# Patient Record
Sex: Female | Born: 1960 | Race: White | Hispanic: No | Marital: Married | Smoking: Never smoker
Health system: Southern US, Community
[De-identification: ages and names within clinical notes are randomized; demographics above are authoritative.]

## PROBLEM LIST (undated history)

## (undated) DIAGNOSIS — I10 Essential (primary) hypertension: Secondary | ICD-10-CM

## (undated) HISTORY — DX: Essential (primary) hypertension: I10

---

## 1982-07-28 HISTORY — PX: BREAST EXCISIONAL BIOPSY: SUR124

## 2004-10-31 ENCOUNTER — Ambulatory Visit: Payer: Self-pay | Admitting: Unknown Physician Specialty

## 2005-12-17 ENCOUNTER — Ambulatory Visit: Payer: Self-pay | Admitting: Unknown Physician Specialty

## 2007-01-21 ENCOUNTER — Ambulatory Visit: Payer: Self-pay | Admitting: Unknown Physician Specialty

## 2007-07-09 ENCOUNTER — Ambulatory Visit: Payer: Self-pay | Admitting: General Surgery

## 2008-01-11 ENCOUNTER — Ambulatory Visit: Payer: Self-pay | Admitting: Cardiology

## 2008-01-21 ENCOUNTER — Ambulatory Visit: Payer: Self-pay | Admitting: Unknown Physician Specialty

## 2009-01-22 ENCOUNTER — Ambulatory Visit: Payer: Self-pay | Admitting: Unknown Physician Specialty

## 2009-02-06 ENCOUNTER — Ambulatory Visit: Payer: Self-pay | Admitting: Unknown Physician Specialty

## 2010-02-08 ENCOUNTER — Ambulatory Visit: Payer: Self-pay | Admitting: General Surgery

## 2010-03-05 ENCOUNTER — Ambulatory Visit: Payer: Self-pay | Admitting: Unknown Physician Specialty

## 2010-11-26 ENCOUNTER — Ambulatory Visit: Payer: Self-pay | Admitting: Unknown Physician Specialty

## 2011-03-11 ENCOUNTER — Ambulatory Visit: Payer: Self-pay | Admitting: Unknown Physician Specialty

## 2012-03-16 ENCOUNTER — Ambulatory Visit: Payer: Self-pay | Admitting: Unknown Physician Specialty

## 2013-03-22 ENCOUNTER — Ambulatory Visit: Payer: Self-pay | Admitting: Unknown Physician Specialty

## 2013-12-09 DIAGNOSIS — I10 Essential (primary) hypertension: Secondary | ICD-10-CM | POA: Insufficient documentation

## 2014-03-23 ENCOUNTER — Ambulatory Visit: Payer: Self-pay | Admitting: Unknown Physician Specialty

## 2015-03-26 ENCOUNTER — Other Ambulatory Visit: Payer: Self-pay | Admitting: Unknown Physician Specialty

## 2015-03-26 DIAGNOSIS — Z1231 Encounter for screening mammogram for malignant neoplasm of breast: Secondary | ICD-10-CM

## 2015-03-28 ENCOUNTER — Ambulatory Visit
Admission: RE | Admit: 2015-03-28 | Discharge: 2015-03-28 | Disposition: A | Discharge status: Home / Self Care | Payer: PRIVATE HEALTH INSURANCE | Source: Ambulatory Visit | Attending: Unknown Physician Specialty | Admitting: Unknown Physician Specialty

## 2015-03-28 DIAGNOSIS — Z1231 Encounter for screening mammogram for malignant neoplasm of breast: Secondary | ICD-10-CM | POA: Diagnosis present

## 2015-07-13 ENCOUNTER — Encounter: Payer: Self-pay | Admitting: Physician Assistant

## 2015-07-13 ENCOUNTER — Ambulatory Visit: Payer: Self-pay | Admitting: Physician Assistant

## 2015-07-13 VITALS — BP 130/70 | HR 108 | Temp 97.8°F

## 2015-07-13 DIAGNOSIS — J209 Acute bronchitis, unspecified: Secondary | ICD-10-CM

## 2015-07-13 MED ORDER — PSEUDOEPH-BROMPHEN-DM 30-2-10 MG/5ML PO SYRP: 5.0000 mL | ORAL_SOLUTION | Freq: Four times a day (QID) | ORAL | Status: DC | PRN

## 2015-07-13 MED ORDER — AZITHROMYCIN 250 MG PO TABS: ORAL_TABLET | ORAL | Status: DC

## 2015-07-13 NOTE — Progress Notes (Signed)
S.  Patient c/o cough and chest congestion for 5 days.  Denies Fever/chill or nausea, vomiting, and diarrhea. No palliative measure for compliant. O.  No acute distress.  HEENT unremarkable except for non-productive cough.  Neck supple, Lung with upper lobe Rales. Heart RRR. A. Bronchitis. P.Z-Pack and Bromfed DM.  F/U PRN.

## 2015-08-30 ENCOUNTER — Other Ambulatory Visit: Payer: Self-pay | Admitting: Emergency Medicine

## 2015-08-30 MED ORDER — LOSARTAN POTASSIUM 50 MG PO TABS: 50.0000 mg | ORAL_TABLET | Freq: Every day | ORAL | Status: DC

## 2015-08-30 NOTE — Telephone Encounter (Signed)
Received a faxed medication request from [facility] on [address]  Please advise.  Thank you. 

## 2015-08-30 NOTE — Telephone Encounter (Signed)
Med refill approved, pt had labs in June/2016 and were normal, recent visits show normal bp

## 2015-10-25 DIAGNOSIS — Z8679 Personal history of other diseases of the circulatory system: Secondary | ICD-10-CM | POA: Insufficient documentation

## 2015-11-06 ENCOUNTER — Encounter: Payer: Self-pay | Admitting: *Deleted

## 2015-11-14 ENCOUNTER — Ambulatory Visit: Payer: Self-pay | Admitting: General Surgery

## 2016-03-10 ENCOUNTER — Other Ambulatory Visit: Payer: Self-pay | Admitting: Obstetrics & Gynecology

## 2016-03-10 DIAGNOSIS — Z1231 Encounter for screening mammogram for malignant neoplasm of breast: Secondary | ICD-10-CM

## 2016-04-01 ENCOUNTER — Other Ambulatory Visit: Payer: Self-pay | Admitting: Physician Assistant

## 2016-04-01 NOTE — Telephone Encounter (Signed)
Med refill approved, pt needs fasting labs prior to med refill

## 2016-04-02 ENCOUNTER — Other Ambulatory Visit: Payer: Self-pay | Admitting: Obstetrics & Gynecology

## 2016-04-02 ENCOUNTER — Ambulatory Visit
Admission: RE | Admit: 2016-04-02 | Discharge: 2016-04-02 | Disposition: A | Discharge status: Home / Self Care | Payer: Managed Care, Other (non HMO) | Source: Ambulatory Visit | Attending: Obstetrics & Gynecology | Admitting: Obstetrics & Gynecology

## 2016-04-02 DIAGNOSIS — Z1231 Encounter for screening mammogram for malignant neoplasm of breast: Secondary | ICD-10-CM | POA: Diagnosis present

## 2016-04-04 ENCOUNTER — Other Ambulatory Visit: Payer: Self-pay

## 2016-04-04 DIAGNOSIS — Z299 Encounter for prophylactic measures, unspecified: Secondary | ICD-10-CM

## 2016-04-04 NOTE — Progress Notes (Signed)
Patient came in to have blood drawn for testing per Susan's authorization. 

## 2016-04-05 LAB — CMP12+LP+TP+TSH+6AC+CBC/D/PLT
A/G RATIO: 1.9 (ref 1.2–2.2)
ALBUMIN: 4.6 g/dL (ref 3.5–5.5)
ALT: 21 IU/L (ref 0–32)
AST: 22 IU/L (ref 0–40)
Alkaline Phosphatase: 67 IU/L (ref 39–117)
BASOS ABS: 0 10*3/uL (ref 0.0–0.2)
BUN / CREAT RATIO: 24 — AB (ref 9–23)
BUN: 19 mg/dL (ref 6–24)
Basos: 1 %
Bilirubin Total: 0.5 mg/dL (ref 0.0–1.2)
CHLORIDE: 101 mmol/L (ref 96–106)
CHOLESTEROL TOTAL: 234 mg/dL — AB (ref 100–199)
CREATININE: 0.79 mg/dL (ref 0.57–1.00)
Calcium: 10.1 mg/dL (ref 8.7–10.2)
Chol/HDL Ratio: 2.7 ratio units (ref 0.0–4.4)
EOS (ABSOLUTE): 0 10*3/uL (ref 0.0–0.4)
EOS: 1 %
Free Thyroxine Index: 2.4 (ref 1.2–4.9)
GFR, EST AFRICAN AMERICAN: 97 mL/min/{1.73_m2} (ref >59)
GFR, EST NON AFRICAN AMERICAN: 84 mL/min/{1.73_m2} (ref >59)
GGT: 16 IU/L (ref 0–60)
Globulin, Total: 2.4 g/dL (ref 1.5–4.5)
Glucose: 91 mg/dL (ref 65–99)
HDL: 87 mg/dL (ref >39)
Hematocrit: 37.3 % (ref 34.0–46.6)
Hemoglobin: 12.5 g/dL (ref 11.1–15.9)
IRON: 100 ug/dL (ref 27–159)
Immature Grans (Abs): 0 10*3/uL (ref 0.0–0.1)
Immature Granulocytes: 0 %
LDH: 218 IU/L (ref 119–226)
LDL Calculated: 136 mg/dL — ABNORMAL HIGH (ref 0–99)
LYMPHS ABS: 1.4 10*3/uL (ref 0.7–3.1)
Lymphs: 41 %
MCH: 32.6 pg (ref 26.6–33.0)
MCHC: 33.5 g/dL (ref 31.5–35.7)
MCV: 97 fL (ref 79–97)
MONOCYTES: 6 %
Monocytes Absolute: 0.2 10*3/uL (ref 0.1–0.9)
NEUTROS ABS: 1.8 10*3/uL (ref 1.4–7.0)
Neutrophils: 51 %
PHOSPHORUS: 4.1 mg/dL (ref 2.5–4.5)
PLATELETS: 219 10*3/uL (ref 150–379)
POTASSIUM: 4.9 mmol/L (ref 3.5–5.2)
RBC: 3.84 x10E6/uL (ref 3.77–5.28)
RDW: 14.4 % (ref 12.3–15.4)
Sodium: 142 mmol/L (ref 134–144)
T3 UPTAKE RATIO: 30 % (ref 24–39)
T4 TOTAL: 7.9 ug/dL (ref 4.5–12.0)
TOTAL PROTEIN: 7 g/dL (ref 6.0–8.5)
TSH: 2.17 u[IU]/mL (ref 0.450–4.500)
Triglycerides: 54 mg/dL (ref 0–149)
URIC ACID: 3.9 mg/dL (ref 2.5–7.1)
VLDL CHOLESTEROL CAL: 11 mg/dL (ref 5–40)
WBC: 3.4 10*3/uL (ref 3.4–10.8)

## 2016-04-05 LAB — VITAMIN D 25 HYDROXY (VIT D DEFICIENCY, FRACTURES): Vit D, 25-Hydroxy: 36.4 ng/mL (ref 30.0–100.0)

## 2016-04-30 ENCOUNTER — Other Ambulatory Visit: Payer: Self-pay | Admitting: Physician Assistant

## 2016-05-03 ENCOUNTER — Other Ambulatory Visit: Payer: Self-pay | Admitting: Physician Assistant

## 2016-05-06 ENCOUNTER — Ambulatory Visit: Payer: Self-pay | Admitting: Physician Assistant

## 2016-05-06 ENCOUNTER — Encounter: Payer: Self-pay | Admitting: Physician Assistant

## 2016-05-06 VITALS — BP 160/80 | HR 104 | Temp 98.5°F

## 2016-05-06 DIAGNOSIS — I10 Essential (primary) hypertension: Secondary | ICD-10-CM

## 2016-05-06 MED ORDER — LOSARTAN POTASSIUM 50 MG PO TABS: 50.0000 mg | ORAL_TABLET | Freq: Every day | ORAL | 11 refills | Status: DC

## 2016-05-06 NOTE — Progress Notes (Signed)
S: here for bp check, labs were normal, needs med refill on losartan, saw her cardiologist in April  O: vitals wnl nad, lungs c t a, cv rrr, no bruits noted at carotids, neuro intact  A: htn  P: losartan 50mg  qd #30 11 refills

## 2016-07-10 DIAGNOSIS — S29012A Strain of muscle and tendon of back wall of thorax, initial encounter: Secondary | ICD-10-CM | POA: Insufficient documentation

## 2016-09-11 ENCOUNTER — Encounter: Payer: Self-pay | Admitting: Physician Assistant

## 2016-09-11 ENCOUNTER — Ambulatory Visit: Payer: Self-pay | Admitting: Physician Assistant

## 2016-09-11 VITALS — BP 130/80 | HR 99 | Temp 98.2°F

## 2016-09-11 DIAGNOSIS — Z0189 Encounter for other specified special examinations: Secondary | ICD-10-CM

## 2016-09-11 DIAGNOSIS — Z008 Encounter for other general examination: Secondary | ICD-10-CM

## 2016-09-11 DIAGNOSIS — R21 Rash and other nonspecific skin eruption: Secondary | ICD-10-CM

## 2016-09-11 MED ORDER — FLUCONAZOLE 150 MG PO TABS: ORAL_TABLET | ORAL | 0 refills | Status: DC

## 2016-09-11 MED ORDER — DEXAMETHASONE SODIUM PHOSPHATE 10 MG/ML IJ SOLN
10.0000 mg | Freq: Once | INTRAMUSCULAR | Status: AC
  Administered 2016-09-11: 10 mg via INTRAMUSCULAR

## 2016-09-11 NOTE — Progress Notes (Signed)
S: c/o rash on neck, chest, and up on left jaw/face, areas are very itchy, no new exposures, did start a high protein diet about 3 weeks ago, sx started after that, sx have gotten worse over last few days, no blisters or open areas  O: vitals wnl, nad, skin with long scratch marks on neck, chest, dry patch on left cheek, no vesicles, no blisters, no pustules noted, lungs c t a, cv rrr  A: rash  P: decadron 10mg  qd, diflucan in case yeast from high protein diet

## 2017-01-26 ENCOUNTER — Other Ambulatory Visit: Payer: Self-pay

## 2017-01-26 ENCOUNTER — Encounter (INDEPENDENT_AMBULATORY_CARE_PROVIDER_SITE_OTHER): Payer: Self-pay

## 2017-01-26 DIAGNOSIS — Z299 Encounter for prophylactic measures, unspecified: Secondary | ICD-10-CM

## 2017-01-26 NOTE — Progress Notes (Signed)
Patient came in to have blood drawn for a new patient appointment with [facility] Dal[facility]

## 2017-01-27 LAB — VITAMIN D 25 HYDROXY (VIT D DEFICIENCY, FRACTURES)

## 2017-01-27 LAB — CMP12+LP+TP+TSH+6AC+CBC/D/PLT
BASOS ABS: 0 10*3/uL (ref 0.0–0.2)
Basos: 0 %
EOS (ABSOLUTE): 0 10*3/uL (ref 0.0–0.4)
Eos: 0 %
Hematocrit: 39.7 % (ref 34.0–46.6)
Hemoglobin: 13 g/dL (ref 11.1–15.9)
Immature Grans (Abs): 0 10*3/uL (ref 0.0–0.1)
Immature Granulocytes: 0 %
LYMPHS ABS: 1.1 10*3/uL (ref 0.7–3.1)
Lymphs: 30 %
MCH: 32.7 pg (ref 26.6–33.0)
MCHC: 32.7 g/dL (ref 31.5–35.7)
MCV: 100 fL — ABNORMAL HIGH (ref 79–97)
MONOS ABS: 0.2 10*3/uL (ref 0.1–0.9)
Monocytes: 5 %
Neutrophils Absolute: 2.5 10*3/uL (ref 1.4–7.0)
Neutrophils: 65 %
PLATELETS: 227 10*3/uL (ref 150–379)
RBC: 3.98 x10E6/uL (ref 3.77–5.28)
RDW: 14.4 % (ref 12.3–15.4)
WBC: 3.8 10*3/uL (ref 3.4–10.8)

## 2017-01-29 ENCOUNTER — Other Ambulatory Visit: Payer: Self-pay | Admitting: Physician Assistant

## 2017-01-29 DIAGNOSIS — Z299 Encounter for prophylactic measures, unspecified: Secondary | ICD-10-CM

## 2017-01-30 LAB — COMPREHENSIVE METABOLIC PANEL
ALT: 19 IU/L (ref 0–32)
AST: 15 IU/L (ref 0–40)
Albumin/Globulin Ratio: 2 (ref 1.2–2.2)
Albumin: 4.9 g/dL (ref 3.5–5.5)
Alkaline Phosphatase: 68 IU/L (ref 39–117)
BILIRUBIN TOTAL: 0.3 mg/dL (ref 0.0–1.2)
BUN/Creatinine Ratio: 14 (ref 9–23)
BUN: 11 mg/dL (ref 6–24)
CHLORIDE: 104 mmol/L (ref 96–106)
CO2: 24 mmol/L (ref 20–29)
Calcium: 10.4 mg/dL — ABNORMAL HIGH (ref 8.7–10.2)
Creatinine, Ser: 0.79 mg/dL (ref 0.57–1.00)
GFR calc Af Amer: 96 mL/min/{1.73_m2} (ref >59)
GFR calc non Af Amer: 83 mL/min/{1.73_m2} (ref >59)
GLUCOSE: 99 mg/dL (ref 65–99)
Globulin, Total: 2.4 g/dL (ref 1.5–4.5)
Potassium: 4.8 mmol/L (ref 3.5–5.2)
Sodium: 146 mmol/L — ABNORMAL HIGH (ref 134–144)
Total Protein: 7.3 g/dL (ref 6.0–8.5)

## 2017-01-30 LAB — LIPID PANEL
Chol/HDL Ratio: 2.8 ratio (ref 0.0–4.4)
Cholesterol, Total: 218 mg/dL — ABNORMAL HIGH (ref 100–199)
HDL: 79 mg/dL (ref >39)
LDL Calculated: 126 mg/dL — ABNORMAL HIGH (ref 0–99)
Triglycerides: 65 mg/dL (ref 0–149)
VLDL CHOLESTEROL CAL: 13 mg/dL (ref 5–40)

## 2017-01-30 LAB — THYROID PANEL WITH TSH
Free Thyroxine Index: 3 (ref 1.2–4.9)
T3 Uptake Ratio: 31 % (ref 24–39)
T4, Total: 9.6 ug/dL (ref 4.5–12.0)
TSH: 2.8 u[IU]/mL (ref 0.450–4.500)

## 2017-01-30 LAB — VITAMIN D 25 HYDROXY (VIT D DEFICIENCY, FRACTURES): VIT D 25 HYDROXY: 36.3 ng/mL (ref 30.0–100.0)

## 2017-02-12 ENCOUNTER — Other Ambulatory Visit: Payer: Self-pay | Admitting: Obstetrics and Gynecology

## 2017-02-12 DIAGNOSIS — Z1231 Encounter for screening mammogram for malignant neoplasm of breast: Secondary | ICD-10-CM

## 2017-04-06 ENCOUNTER — Ambulatory Visit
Admission: RE | Admit: 2017-04-06 | Discharge: 2017-04-06 | Disposition: A | Discharge status: Home / Self Care | Payer: Managed Care, Other (non HMO) | Source: Ambulatory Visit | Attending: Obstetrics and Gynecology | Admitting: Obstetrics and Gynecology

## 2017-04-06 ENCOUNTER — Ambulatory Visit: Payer: Self-pay | Admitting: Physician Assistant

## 2017-04-06 ENCOUNTER — Encounter: Payer: Self-pay | Admitting: Physician Assistant

## 2017-04-06 VITALS — BP 138/80 | HR 78 | Temp 98.4°F

## 2017-04-06 DIAGNOSIS — Z1231 Encounter for screening mammogram for malignant neoplasm of breast: Secondary | ICD-10-CM | POA: Insufficient documentation

## 2017-04-06 DIAGNOSIS — I10 Essential (primary) hypertension: Secondary | ICD-10-CM

## 2017-04-06 MED ORDER — LOSARTAN POTASSIUM 50 MG PO TABS: 50.0000 mg | ORAL_TABLET | Freq: Every day | ORAL | 11 refills | Status: DC

## 2017-04-06 NOTE — Progress Notes (Signed)
S: here for bp med refill, had a full physical by her gyn in July, labs were normal, no complaints  O: vitals w bp 138/80, lungs c t a, cv rrr  A: htn  P: refill on losartan

## 2018-01-27 ENCOUNTER — Other Ambulatory Visit: Payer: Self-pay

## 2018-01-27 DIAGNOSIS — Z0000 Encounter for general adult medical examination without abnormal findings: Secondary | ICD-10-CM

## 2018-01-28 LAB — LIPID PANEL
CHOLESTEROL TOTAL: 267 mg/dL — AB (ref 100–199)
Chol/HDL Ratio: 3.1 ratio (ref 0.0–4.4)
HDL: 87 mg/dL (ref >39)
LDL CALC: 163 mg/dL — AB (ref 0–99)
Triglycerides: 84 mg/dL (ref 0–149)
VLDL CHOLESTEROL CAL: 17 mg/dL (ref 5–40)

## 2018-01-28 LAB — CBC WITH DIFFERENTIAL/PLATELET
BASOS ABS: 0 10*3/uL (ref 0.0–0.2)
Basos: 1 %
EOS (ABSOLUTE): 0.1 10*3/uL (ref 0.0–0.4)
Eos: 2 %
HEMOGLOBIN: 13.2 g/dL (ref 11.1–15.9)
Hematocrit: 39.6 % (ref 34.0–46.6)
IMMATURE GRANS (ABS): 0 10*3/uL (ref 0.0–0.1)
IMMATURE GRANULOCYTES: 0 %
LYMPHS: 41 %
Lymphocytes Absolute: 1.4 10*3/uL (ref 0.7–3.1)
MCH: 33.2 pg — AB (ref 26.6–33.0)
MCHC: 33.3 g/dL (ref 31.5–35.7)
MCV: 100 fL — ABNORMAL HIGH (ref 79–97)
MONOCYTES: 8 %
Monocytes Absolute: 0.3 10*3/uL (ref 0.1–0.9)
NEUTROS ABS: 1.6 10*3/uL (ref 1.4–7.0)
NEUTROS PCT: 48 %
PLATELETS: 227 10*3/uL (ref 150–450)
RBC: 3.98 x10E6/uL (ref 3.77–5.28)
RDW: 13.1 % (ref 12.3–15.4)
WBC: 3.4 10*3/uL (ref 3.4–10.8)

## 2018-01-28 LAB — COMPREHENSIVE METABOLIC PANEL
ALBUMIN: 4.6 g/dL (ref 3.5–5.5)
ALT: 26 IU/L (ref 0–32)
AST: 23 IU/L (ref 0–40)
Albumin/Globulin Ratio: 1.8 (ref 1.2–2.2)
Alkaline Phosphatase: 78 IU/L (ref 39–117)
BILIRUBIN TOTAL: 0.4 mg/dL (ref 0.0–1.2)
BUN/Creatinine Ratio: 21 (ref 9–23)
BUN: 18 mg/dL (ref 6–24)
CHLORIDE: 100 mmol/L (ref 96–106)
CO2: 24 mmol/L (ref 20–29)
CREATININE: 0.84 mg/dL (ref 0.57–1.00)
Calcium: 9.7 mg/dL (ref 8.7–10.2)
GFR calc non Af Amer: 77 mL/min/{1.73_m2} (ref >59)
GFR, EST AFRICAN AMERICAN: 89 mL/min/{1.73_m2} (ref >59)
GLUCOSE: 93 mg/dL (ref 65–99)
Globulin, Total: 2.6 g/dL (ref 1.5–4.5)
Potassium: 4.8 mmol/L (ref 3.5–5.2)
Sodium: 140 mmol/L (ref 134–144)
TOTAL PROTEIN: 7.2 g/dL (ref 6.0–8.5)

## 2018-01-28 LAB — TSH: TSH: 3.01 u[IU]/mL (ref 0.450–4.500)

## 2018-02-16 ENCOUNTER — Other Ambulatory Visit: Payer: Self-pay | Admitting: Obstetrics and Gynecology

## 2018-02-16 DIAGNOSIS — Z1231 Encounter for screening mammogram for malignant neoplasm of breast: Secondary | ICD-10-CM

## 2018-04-08 ENCOUNTER — Ambulatory Visit
Admission: RE | Admit: 2018-04-08 | Discharge: 2018-04-08 | Disposition: A | Discharge status: Home / Self Care | Payer: Managed Care, Other (non HMO) | Source: Ambulatory Visit | Attending: Obstetrics and Gynecology | Admitting: Obstetrics and Gynecology

## 2018-04-08 DIAGNOSIS — Z1231 Encounter for screening mammogram for malignant neoplasm of breast: Secondary | ICD-10-CM | POA: Diagnosis not present

## 2018-04-23 ENCOUNTER — Ambulatory Visit: Payer: Self-pay | Admitting: Family Medicine

## 2018-04-23 VITALS — BP 136/90 | HR 95 | Temp 99.3°F | Resp 14

## 2018-04-23 DIAGNOSIS — R05 Cough: Secondary | ICD-10-CM

## 2018-04-23 DIAGNOSIS — R059 Cough, unspecified: Secondary | ICD-10-CM

## 2018-04-23 DIAGNOSIS — J0100 Acute maxillary sinusitis, unspecified: Secondary | ICD-10-CM

## 2018-04-23 MED ORDER — AMOXICILLIN-POT CLAVULANATE 875-125 MG PO TABS: 1.0000 | ORAL_TABLET | Freq: Two times a day (BID) | ORAL | 0 refills | Status: AC

## 2018-04-23 MED ORDER — BENZONATATE 200 MG PO CAPS: 200.0000 mg | ORAL_CAPSULE | Freq: Every evening | ORAL | 0 refills | Status: DC | PRN

## 2018-04-23 NOTE — Progress Notes (Signed)
Subjective: Congestion     Sherry Kelly is a 57 y.o. female who presents for evaluation of nasal congestion, right-sided facial pressure, sore throat, right ear fullness, fatigue, and productive cough with green sputum for 3-4 days.  Patient reports gradual worsening of right-sided facial pressure. Treatment to date: OTC cough suppressant.  Denies rash, nausea, vomiting, diarrhea, SOB, wheezing, chest or back pain, ear pain,  difficulty swallowing, confusion, headache, body aches, fever, chills, severe symptoms, or initial improvement and then worsening of symptoms. History of smoking, asthma, COPD: Negative. History of recurrent sinus and/or lung infections: Negative. Medical history: Patient denies any. Antibiotic use in the last 3 months: Patient denies.   Review of Systems Pertinent items noted in HPI and remainder of comprehensive ROS otherwise negative.     Objective:   Physical Exam General: Awake, alert, and oriented. No acute distress. Well developed, hydrated and nourished. Appears stated age. Nontoxic appearance.  HEENT:  PND noted.  Mild erythema to posterior oropharynx.  No edema or exudates of pharynx or tonsils. No erythema or bulging of TM.  Fullness to right tympanic membrane with clear air/fluid level present.  No opaque fluid and TMs intact bilaterally.  Ear canals clear bilaterally.  Mild erythema/edema to nasal mucosa. Sinuses nontender. Supple neck without adenopathy. Cardiac: Heart rate and rhythm are normal. No murmurs, gallops, or rubs are auscultated. S1 and S2 are heard and are of normal intensity.  Respiratory: No signs of respiratory distress. Lungs clear. No tachypnea. Able to speak in full sentences without dyspnea. Nonlabored respirations.  Skin: Skin is warm, dry and intact. Appropriate color for ethnicity. No cyanosis noted.   Assessment:   Sinusitis Bilateral eustachian tube dysfunction Acute upper respiratory infection  Plan:    Discussed  diagnosis and treatment of URI, sinusitis, and eustachian tube dysfunction. Suggested symptomatic OTC remedies. Nasal saline spray for congestion.   Prescribed Augmentin.  Prescribed Tessalon Perles to use as needed at night for cough.  Recommended Flonase for eustachian tube dysfunction.  Patient already has this at home.  Provided her with instructions for use.  Patient denies any history of eye disorders such as glaucoma. Discussed side/adverse effects of medications. Discussed red flag symptoms and circumstances with which to seek medical care.   New Prescriptions   AMOXICILLIN-CLAVULANATE (AUGMENTIN) 875-125 MG TABLET    Take 1 tablet by mouth 2 (two) times daily for 10 days.   BENZONATATE (TESSALON) 200 MG CAPSULE    Take 1 capsule (200 mg total) by mouth at bedtime as needed for cough.

## 2018-10-04 ENCOUNTER — Encounter: Payer: Self-pay | Admitting: Adult Health

## 2018-10-04 ENCOUNTER — Ambulatory Visit: Payer: Managed Care, Other (non HMO) | Admitting: Adult Health

## 2018-10-04 VITALS — BP 140/80 | HR 97 | Temp 98.3°F | Resp 14

## 2018-10-04 DIAGNOSIS — J020 Streptococcal pharyngitis: Secondary | ICD-10-CM

## 2018-10-04 LAB — POCT RAPID STREP A (OFFICE): RAPID STREP A SCREEN: POSITIVE — AB

## 2018-10-04 MED ORDER — AMOXICILLIN 875 MG PO TABS: 875.0000 mg | ORAL_TABLET | Freq: Two times a day (BID) | ORAL | 0 refills | Status: DC

## 2018-10-04 NOTE — Patient Instructions (Signed)
Pharyngitis  Pharyngitis is a sore throat (pharynx). This is when there is redness, pain, and swelling in your throat. Most of the time, this condition gets better on its own. In some cases, you may need medicine. Follow these instructions at home:  Take over-the-counter and prescription medicines only as told by your doctor. ? If you were prescribed an antibiotic medicine, take it as told by your doctor. Do not stop taking the antibiotic even if you start to feel better. ? Do not give children aspirin. Aspirin has been linked to Reye syndrome.  Drink enough water and fluids to keep your pee (urine) clear or pale yellow.  Get a lot of rest.  Rinse your mouth (gargle) with a salt-water mixture 3-4 times a day or as needed. To make a salt-water mixture, completely dissolve -1 tsp of salt in 1 cup of warm water.  If your doctor approves, you may use throat lozenges or sprays to soothe your throat. Contact a doctor if:  You have large, tender lumps in your neck.  You have a rash.  You cough up green, yellow-brown, or bloody spit. Get help right away if:  You have a stiff neck.  You drool or cannot swallow liquids.  You cannot drink or take medicines without throwing up.  You have very bad pain that does not go away with medicine.  You have problems breathing, and it is not from a stuffy nose.  You have new pain and swelling in your knees, ankles, wrists, or elbows. Summary  Pharyngitis is a sore throat (pharynx). This is when there is redness, pain, and swelling in your throat.  If you were prescribed an antibiotic medicine, take it as told by your doctor. Do not stop taking the antibiotic even if you start to feel better.  Most of the time, pharyngitis gets better on its own. Sometimes, you may need medicine. This information is not intended to replace advice given to you by your health care provider. Make sure you discuss any questions you have with your health care  provider. Document Released: 12/31/2007 Document Revised: 08/19/2016 Document Reviewed: 08/19/2016 Elsevier Interactive Patient Education  2019 Elsevier Inc. Amoxicillin capsules or tablets What is this medicine? AMOXICILLIN (a mox i SIL in) is a penicillin antibiotic. It is used to treat certain kinds of bacterial infections. It will not work for colds, flu, or other viral infections. This medicine may be used for other purposes; ask your health care provider or pharmacist if you have questions. COMMON BRAND NAME(S): Amoxil, Moxilin, Sumox, Trimox What should I tell my health care provider before I take this medicine? They need to know if you have any of these conditions: -asthma -kidney disease -an unusual or allergic reaction to amoxicillin, other penicillins, cephalosporin antibiotics, other medicines, foods, dyes, or preservatives -pregnant or trying to get pregnant -breast-feeding How should I use this medicine? Take this medicine by mouth with a glass of water. Follow the directions on your prescription label. You may take this medicine with food or on an empty stomach. Take your medicine at regular intervals. Do not take your medicine more often than directed. Take all of your medicine as directed even if you think your are better. Do not skip doses or stop your medicine early. Talk to your pediatrician regarding the use of this medicine in children. While this drug may be prescribed for selected conditions, precautions do apply. Overdosage: If you think you have taken too much of this medicine contact a poison  control center or emergency room at once. NOTE: This medicine is only for you. Do not share this medicine with others. What if I miss a dose? If you miss a dose, take it as soon as you can. If it is almost time for your next dose, take only that dose. Do not take double or extra doses. What may interact with this medicine? -amiloride -birth control  pills -chloramphenicol -macrolides -probenecid -sulfonamides -tetracyclines This list may not describe all possible interactions. Give your health care provider a list of all the medicines, herbs, non-prescription drugs, or dietary supplements you use. Also tell them if you smoke, drink alcohol, or use illegal drugs. Some items may interact with your medicine. What should I watch for while using this medicine? Tell your doctor or health care professional if your symptoms do not improve in 2 or 3 days. Take all of the doses of your medicine as directed. Do not skip doses or stop your medicine early. If you are diabetic, you may get a false positive result for sugar in your urine with certain brands of urine tests. Check with your doctor. Do not treat diarrhea with over-the-counter products. Contact your doctor if you have diarrhea that lasts more than 2 days or if the diarrhea is severe and watery. What side effects may I notice from receiving this medicine? Side effects that you should report to your doctor or health care professional as soon as possible: -allergic reactions like skin rash, itching or hives, swelling of the face, lips, or tongue -breathing problems -dark urine -redness, blistering, peeling or loosening of the skin, including inside the mouth -seizures -severe or watery diarrhea -trouble passing urine or change in the amount of urine -unusual bleeding or bruising -unusually weak or tired -yellowing of the eyes or skin Side effects that usually do not require medical attention (report to your doctor or health care professional if they continue or are bothersome): -dizziness -headache -stomach upset -trouble sleeping This list may not describe all possible side effects. Call your doctor for medical advice about side effects. You may report side effects to FDA at 1-800-FDA-1088. Where should I keep my medicine? Keep out of the reach of children. Store between 68 and 77  degrees F (20 and 25 degrees C). Keep bottle closed tightly. Throw away any unused medicine after the expiration date. NOTE: This sheet is a summary. It may not cover all possible information. If you have questions about this medicine, talk to your doctor, pharmacist, or health care provider.  2019 Elsevier/Gold Standard (2007-10-05 14:10:59)

## 2018-10-04 NOTE — Progress Notes (Signed)
[facility] Subjective:     Patient ID: Sherry Kelly, female   DOB: March 07, 1961, 58 y.o.   MRN: [id number]  HPI   Blood pressure 140/80, pulse 97, temperature 98.3 F (36.8 C), temperature source Oral, resp. rate 14, SpO2 100 %. Patient is a 58 year old female in no acute distress who comes to the clinic for complaints of sore throat since  March 6th 2020.  Denies any known exposures - she keeps her 49 month old grand child.   No Known Allergies   Patient  denies any fever, body aches,chills, rash, chest pain, shortness of breath, nausea, vomiting, or diarrhea.   Christeen Douglas, MD is primary care.   No Known Allergies   Patient Active Problem List   Diagnosis Date Noted  . Strain of thoracic back region 07/10/2016  . H/O sinus tachycardia 10/25/2015  . Hypertension 12/09/2013    Current Outpatient Medications:  .  citalopram (CELEXA) 20 MG tablet, Take by mouth., Disp: , Rfl:  .  irbesartan (AVAPRO) 150 MG tablet, irbesartan 150 mg tablet, Disp: , Rfl:    Review of Systems  Constitutional: Negative.   HENT: Positive for rhinorrhea and sore throat. Negative for congestion, dental problem, drooling, ear discharge, ear pain, facial swelling, hearing loss, mouth sores, nosebleeds, postnasal drip, sinus pressure, sinus pain, sneezing, tinnitus, trouble swallowing and voice change.   Eyes: Negative.   Respiratory: Negative.   Cardiovascular: Negative.   Gastrointestinal: Negative.   Endocrine: Negative for polydipsia, polyphagia and polyuria.  Genitourinary: Negative.   Musculoskeletal: Negative.   Skin: Negative.   Allergic/Immunologic: Negative.   Neurological: Negative.   Hematological: Negative.   Psychiatric/Behavioral: Negative.      Objective:   Physical Exam Vitals signs reviewed.  Constitutional:      General: She is not in acute distress.    Appearance: She is well-developed and normal weight. She is not  ill-appearing, toxic-appearing or diaphoretic.     Comments: Patient is alert and oriented and responsive to questions Engages in eye contact with provider. Speaks in full sentences without any pauses without any shortness of breath or distress.  No Stridor.  HENT:     Head: Normocephalic and atraumatic.     Salivary Glands: Right salivary gland is not diffusely enlarged or tender. Left salivary gland is not diffusely enlarged or tender.     Right Ear: Ear canal normal. No drainage, swelling or tenderness. A middle ear effusion is present. Tympanic membrane is not erythematous.     Left Ear: Ear canal normal. No drainage, swelling or tenderness. A middle ear effusion is present. Tympanic membrane is not erythematous.     Nose: Rhinorrhea present. No congestion.     Comments: Scant     Mouth/Throat:     Mouth: Mucous membranes are moist. No oral lesions.     Pharynx: Posterior oropharyngeal erythema present. No pharyngeal swelling, oropharyngeal exudate or uvula swelling.     Tonsils: No tonsillar exudate or tonsillar abscesses. Swelling: 0 on the right. 0 on the left.     Comments: Oropharynx patent. Eyes:     Extraocular Movements:     Right eye: Normal extraocular motion.     Left eye: Normal extraocular motion.     Conjunctiva/sclera: Conjunctivae normal.     Pupils: Pupils are equal, round, and reactive to light.  Neck:     Musculoskeletal: Normal range of motion and neck supple.     Thyroid: No thyromegaly.  Cardiovascular:     Rate and Rhythm: Normal rate and regular rhythm.     Heart sounds: Normal heart sounds. No murmur. No friction rub. No gallop.   Pulmonary:     Effort: Pulmonary effort is normal. No respiratory distress.     Breath sounds: Normal breath sounds. No stridor. No wheezing, rhonchi or rales.  Chest:     Chest wall: No tenderness.  Abdominal:     Palpations: Abdomen is soft.  Lymphadenopathy:     Cervical: Cervical adenopathy (mild shotty soft mobile  slightly tender bilateral  ) present.     Right cervical: Superficial cervical adenopathy present. No deep or posterior cervical adenopathy.    Left cervical: Superficial cervical adenopathy present. No deep or posterior cervical adenopathy.  Skin:    General: Skin is warm.     Capillary Refill: Capillary refill takes less than 2 seconds.     Nails: There is no clubbing.   Neurological:     General: No focal deficit present.     Mental Status: She is alert and oriented to person, place, and time.     GCS: GCS eye subscore is 4. GCS verbal subscore is 5. GCS motor subscore is 6.     Cranial Nerves: Cranial nerves are intact.     Sensory: Sensation is intact.     Motor: Motor function is intact.     Coordination: Coordination is intact.     Gait: Gait is intact. Gait normal.     Comments: Patient moves on and off of exam table and in room without difficulty. Gait is normal in hall and in room. Patient is oriented to person place time and situation. Patient answers questions appropriately and engages in conversation.   Psychiatric:        Attention and Perception: Attention normal.        Mood and Affect: Mood normal.        Speech: Speech normal.        Behavior: Behavior normal. Behavior is cooperative.        Thought Content: Thought content normal.        Cognition and Memory: Cognition normal.       Assessment:   Pharyngitis due to Streptococcus species - Plan: POCT Rapid Strep A-Office (CPT [phone])      Plan:   Eula was seen today for sore throat.  Diagnoses and all orders for this visit:  Pharyngitis due to Streptococcus species -     POCT Rapid Strep A-Office (CPT [phone])  Other orders -     amoxicillin (AMOXIL) 875 MG tablet; Take 1 tablet (875 mg total) by mouth 2 (two) times daily.  Patient declined AVS and reviewed After Visit Summary(AVS) with patient. Patient is advised to read the after visit summary as well and let the provider know if any question, concerns  or clarifications are needed.   Advised patient call the office or your primary care doctor for an appointment if no improvement within 72 hours or if any symptoms change or worsen at any time  Advised ER or urgent Care if after hours or on weekend. Call 911 for emergency symptoms at any time.Patinet verbalized understanding of all instructions given/reviewed and treatment plan and has no further questions or concerns at this time.    Patient verbalized understanding of all instructions given and denies any further questions at this time.

## 2019-02-08 ENCOUNTER — Other Ambulatory Visit: Payer: Self-pay

## 2019-02-08 ENCOUNTER — Other Ambulatory Visit: Payer: Managed Care, Other (non HMO)

## 2019-02-08 DIAGNOSIS — Z0000 Encounter for general adult medical examination without abnormal findings: Secondary | ICD-10-CM | POA: Diagnosis not present

## 2019-02-09 LAB — LIPID PANEL WITH LDL/HDL RATIO
Cholesterol, Total: 263 mg/dL — ABNORMAL HIGH (ref 100–199)
HDL: 73 mg/dL (ref >39)
LDL Calculated: 170 mg/dL — ABNORMAL HIGH (ref 0–99)
LDl/HDL Ratio: 2.3 ratio (ref 0.0–3.2)
Triglycerides: 100 mg/dL (ref 0–149)
VLDL Cholesterol Cal: 20 mg/dL (ref 5–40)

## 2019-02-09 LAB — CBC WITH DIFFERENTIAL/PLATELET
Basophils Absolute: 0 10*3/uL (ref 0.0–0.2)
Basos: 1 %
EOS (ABSOLUTE): 0.1 10*3/uL (ref 0.0–0.4)
Eos: 3 %
Hematocrit: 38.2 % (ref 34.0–46.6)
Hemoglobin: 12.9 g/dL (ref 11.1–15.9)
Immature Grans (Abs): 0 10*3/uL (ref 0.0–0.1)
Immature Granulocytes: 0 %
Lymphocytes Absolute: 1.2 10*3/uL (ref 0.7–3.1)
Lymphs: 41 %
MCH: 33.8 pg — ABNORMAL HIGH (ref 26.6–33.0)
MCHC: 33.8 g/dL (ref 31.5–35.7)
MCV: 100 fL — ABNORMAL HIGH (ref 79–97)
Monocytes Absolute: 0.2 10*3/uL (ref 0.1–0.9)
Monocytes: 6 %
Neutrophils Absolute: 1.4 10*3/uL (ref 1.4–7.0)
Neutrophils: 49 %
Platelets: 204 10*3/uL (ref 150–450)
RBC: 3.82 x10E6/uL (ref 3.77–5.28)
RDW: 13.2 % (ref 11.7–15.4)
WBC: 3 10*3/uL — ABNORMAL LOW (ref 3.4–10.8)

## 2019-02-09 LAB — COMPREHENSIVE METABOLIC PANEL
ALT: 23 IU/L (ref 0–32)
AST: 19 IU/L (ref 0–40)
Albumin/Globulin Ratio: 2.1 (ref 1.2–2.2)
Albumin: 4.4 g/dL (ref 3.8–4.9)
Alkaline Phosphatase: 82 IU/L (ref 39–117)
BUN/Creatinine Ratio: 12 (ref 9–23)
BUN: 10 mg/dL (ref 6–24)
Bilirubin Total: 0.6 mg/dL (ref 0.0–1.2)
CO2: 23 mmol/L (ref 20–29)
Calcium: 9.7 mg/dL (ref 8.7–10.2)
Chloride: 103 mmol/L (ref 96–106)
Creatinine, Ser: 0.84 mg/dL (ref 0.57–1.00)
GFR calc Af Amer: 88 mL/min/{1.73_m2} (ref >59)
GFR calc non Af Amer: 76 mL/min/{1.73_m2} (ref >59)
Globulin, Total: 2.1 g/dL (ref 1.5–4.5)
Glucose: 91 mg/dL (ref 65–99)
Potassium: 4.4 mmol/L (ref 3.5–5.2)
Sodium: 141 mmol/L (ref 134–144)
Total Protein: 6.5 g/dL (ref 6.0–8.5)

## 2019-02-09 LAB — TSH: TSH: 3.31 u[IU]/mL (ref 0.450–4.500)

## 2019-02-22 ENCOUNTER — Other Ambulatory Visit: Payer: Self-pay | Admitting: Obstetrics and Gynecology

## 2019-02-22 DIAGNOSIS — Z1231 Encounter for screening mammogram for malignant neoplasm of breast: Secondary | ICD-10-CM

## 2019-04-11 ENCOUNTER — Ambulatory Visit
Admission: RE | Admit: 2019-04-11 | Discharge: 2019-04-11 | Disposition: A | Discharge status: Home / Self Care | Payer: Managed Care, Other (non HMO) | Source: Ambulatory Visit | Attending: Obstetrics and Gynecology | Admitting: Obstetrics and Gynecology

## 2019-04-11 DIAGNOSIS — Z1231 Encounter for screening mammogram for malignant neoplasm of breast: Secondary | ICD-10-CM | POA: Insufficient documentation

## 2019-06-14 ENCOUNTER — Other Ambulatory Visit: Payer: Self-pay

## 2019-06-14 ENCOUNTER — Other Ambulatory Visit: Payer: Managed Care, Other (non HMO)

## 2019-06-14 DIAGNOSIS — E785 Hyperlipidemia, unspecified: Secondary | ICD-10-CM | POA: Diagnosis not present

## 2019-06-15 LAB — LIPID PANEL
Chol/HDL Ratio: 2.4 ratio (ref 0.0–4.4)
Cholesterol, Total: 181 mg/dL (ref 100–199)
HDL: 74 mg/dL (ref >39)
LDL Chol Calc (NIH): 90 mg/dL (ref 0–99)
Triglycerides: 97 mg/dL (ref 0–149)
VLDL Cholesterol Cal: 17 mg/dL (ref 5–40)

## 2019-06-15 LAB — HEPATIC FUNCTION PANEL
ALT: 21 IU/L (ref 0–32)
AST: 23 IU/L (ref 0–40)
Albumin: 4.5 g/dL (ref 3.8–4.9)
Alkaline Phosphatase: 92 IU/L (ref 39–117)
Bilirubin Total: 0.5 mg/dL (ref 0.0–1.2)
Bilirubin, Direct: 0.15 mg/dL (ref 0.00–0.40)
Total Protein: 6.5 g/dL (ref 6.0–8.5)

## 2019-08-01 ENCOUNTER — Telehealth: Payer: Self-pay

## 2019-08-01 NOTE — Telephone Encounter (Signed)
Ok to schedule New patient visit  ?

## 2019-08-01 NOTE — Telephone Encounter (Signed)
Copied from CRM [id number]. Topic: Appointment Scheduling - Scheduling Inquiry for Clinic >> Jul 28, 2019 12:06 PM Crist Infante wrote: Reason for CRM:  pt states her neighbor Steward Drone Stutts asked Victorino Dike is she will accept her as a pt and she said yes. Pt would like appt .  She has been extremely tired and has gained a lot of weight.  Would like blood work done too. Please advise if ok to schedule.

## 2019-08-01 NOTE — Telephone Encounter (Signed)
Pt advised.  Apt made for 08/26/2019  Thanks,   -Vernona Rieger

## 2019-08-25 NOTE — Progress Notes (Signed)
Patient: Sherry Kelly, Female    DOB: Dec 11, 1960, 59 y.o.   MRN: [id number] Visit Date: 08/26/2019  Today's Provider: Margaretann Loveless, PA-C   Chief Complaint  Patient presents with  . New Patient (Initial Visit)  . Establish Care   Subjective:     Establish Care: Sherry Kelly is a 59 y.o. female who presents today to establish care. She had her physical in July 2020.  Home BP 135/77  She does have an OB/GYN, Dr. Dalbert Garnet. She had labs done in July 2020 and they were essentially normal.   Her only complaint today is her weight. She reports over the last couple of years, since she retired, she has been putting on more weight. However, in January she decided to change how she was eating and start healthier options and smaller portions. She also cut out wine and sodas and increased her water intake. Since starting she has lost 10 pounds.   Otherwise she is fairly healthy. She does have hypertension and hypercholesterolemia. She is on Irbesartan 150mg  with home BP readings in the 130s/70s. She is on Simvastatin 20mg  for high cholesterol. She is also taking fish oil OTC and ASA 81mg . She is also stable on Citalopram 20mg .   She is retired. She is married. She does not smoke. She used to drink wine, but since January has cut out alcohol for weight loss. She does not use recreational drugs.  -----------------------------------------------------------------   Review of Systems  Constitutional: Positive for activity change, fatigue and unexpected weight change.  HENT: Negative.   Eyes: Negative.   Respiratory: Negative.   Cardiovascular: Negative.   Gastrointestinal: Negative.   Endocrine: Negative.   Genitourinary: Negative.   Musculoskeletal: Positive for myalgias.  Skin: Negative.   Allergic/Immunologic: Negative.   Neurological: Negative.   Hematological: Negative.   Psychiatric/Behavioral: Negative.     Social History      She  reports that she has never smoked.  She has never used smokeless tobacco.       Social History   Socioeconomic History  . Marital status: Married    Spouse name: Not on file  . Number of children: Not on file  . Years of education: Not on file  . Highest education level: Not on file  Occupational History  . Not on file  Tobacco Use  . Smoking status: Never Smoker  . Smokeless tobacco: Never Used  Substance and Sexual Activity  . Alcohol use: Not on file  . Drug use: Not on file  . Sexual activity: Not on file  Other Topics Concern  . Not on file  Social History Narrative  . Not on file   Social Determinants of Health   Financial Resource Strain:   . Difficulty of Paying Living Expenses: Not on file  Food Insecurity:   . Worried About in the Last Year: Not on file  . Ran Out of Food in the Last Year: Not on file  Transportation Needs:   . Lack of Transportation (Medical): Not on file  . Lack of Transportation (Non-Medical): Not on file  Physical Activity:   . Days of Exercise per Week: Not on file  . Minutes of Exercise per Session: Not on file  Stress:   . Feeling of Stress : Not on file  Social Connections:   . Frequency of Communication with Friends and Family: Not on file  . Frequency of Social Gatherings with Friends and Family:  Not on file  . Attends Religious Services: Not on file  . Active Member of Clubs or Organizations: Not on file  . Attends Banker Meetings: Not on file  . Marital Status: Not on file    No past medical history on file.   Patient Active Problem List   Diagnosis Date Noted  . Strain of thoracic back region 07/10/2016  . H/O sinus tachycardia 10/25/2015  . Hypertension 12/09/2013    Past Surgical History:  Procedure Laterality Date  . BREAST EXCISIONAL BIOPSY Left 1984   benign    Family History        Family Status  Relation Name Status  . Neg Hx  (Not Specified)        Her family history is negative for Breast cancer.       No Known Allergies   Current Outpatient Medications:  .  amoxicillin (AMOXIL) 875 MG tablet, Take 1 tablet (875 mg total) by mouth 2 (two) times daily., Disp: 20 tablet, Rfl: 0 .  citalopram (CELEXA) 20 MG tablet, Take by mouth., Disp: , Rfl:  .  irbesartan (AVAPRO) 150 MG tablet, irbesartan 150 mg tablet, Disp: , Rfl:    Patient Care Team: Reine Just as PCP - General (Family Medicine)    Objective:    Vitals: BP (!) 175/91 (BP Location: Left Arm, Patient Position: Sitting, Cuff Size: Large)   Pulse (!) 111   Temp 97.6 F (36.4 C) (Temporal)   Resp 16   Ht 5\' 2"  (1.575 m)   Wt 225 lb 3.2 oz (102.2 kg)   BMI 41.19 kg/m    Vitals:   08/26/19 1339  BP: (!) 175/91  Pulse: (!) 111  Resp: 16  Temp: 97.6 F (36.4 C)  TempSrc: Temporal  Weight: 225 lb 3.2 oz (102.2 kg)  Height: 5\' 2"  (1.575 m)     Physical Exam Vitals reviewed.  Constitutional:      General: She is not in acute distress.    Appearance: Normal appearance. She is well-developed. She is obese. She is not ill-appearing or diaphoretic.  HENT:     Head: Normocephalic and atraumatic.     Right Ear: Tympanic membrane, ear canal and external ear normal.     Left Ear: Tympanic membrane, ear canal and external ear normal.  Eyes:     General: No scleral icterus.       Right eye: No discharge.        Left eye: No discharge.     Extraocular Movements: Extraocular movements intact.     Conjunctiva/sclera: Conjunctivae normal.     Pupils: Pupils are equal, round, and reactive to light.  Neck:     Thyroid: No thyromegaly.     Vascular: No JVD.     Trachea: No tracheal deviation.  Cardiovascular:     Rate and Rhythm: Normal rate and regular rhythm.     Pulses: Normal pulses.     Heart sounds: Normal heart sounds. No murmur. No friction rub. No gallop.   Pulmonary:     Effort: Pulmonary effort is normal. No respiratory distress.     Breath sounds: Normal breath sounds. No wheezing or rales.   Chest:     Chest wall: No tenderness.  Abdominal:     General: Bowel sounds are normal. There is no distension.     Palpations: Abdomen is soft. There is no mass.     Tenderness: There is no abdominal tenderness. There is no guarding  or rebound.  Musculoskeletal:        General: No tenderness. Normal range of motion.     Cervical back: Normal range of motion and neck supple.     Right lower leg: No edema.     Left lower leg: No edema.  Lymphadenopathy:     Cervical: No cervical adenopathy.  Skin:    General: Skin is warm and dry.     Capillary Refill: Capillary refill takes less than 2 seconds.  Neurological:     General: No focal deficit present.     Mental Status: She is alert and oriented to person, place, and time. Mental status is at baseline.  Psychiatric:        Mood and Affect: Mood normal.        Behavior: Behavior normal.        Thought Content: Thought content normal.        Judgment: Judgment normal.      Depression Screen PHQ 2/9 Scores 08/26/2019  PHQ - 2 Score 0  PHQ- 9 Score 11       Assessment & Plan:     Routine Health Maintenance and Physical Exam  Exercise Activities and Dietary recommendations Goals   None     Immunization History  Administered Date(s) Administered  . Influenza Inj Mdck Quad Pf 05/17/2018  . Influenza,inj,Quad PF,6+ Mos 05/15/2018  . Influenza,inj,quad, With Preservative 05/28/2016  . Tdap 01/27/2018  . Zoster Recombinat (Shingrix) 05/14/2017    Health Maintenance  Topic Date Due  . Hepatitis C Screening  [birth year]  . HIV Screening  01/14/1975  . PAP SMEAR-Modifier  01/13/1981  . COLONOSCOPY  01/13/2010  . INFLUENZA VACCINE  02/26/2019  . MAMMOGRAM  04/10/2021  . TETANUS/TDAP  01/28/2028     Discussed health benefits of physical activity, and encouraged her to engage in regular exercise appropriate for her age and condition.    1. Essential hypertension Stable. Continue Irbesartan 150mg . Will check labs as  below and f/u pending results. - CBC w/Diff/Platelet - Comprehensive Metabolic Panel (CMET) - T4 AND TSH - Hepatitis C Antibody - HIV antibody (with reflex) - Lipid Panel With LDL/HDL Ratio - HgB A1c - ANA,IFA RA Diag Pnl w/rflx Tit/Patn  2. Hypercholesterolemia Stable. Continue Simvastatin 20mg .   3. Class 3 severe obesity due to excess calories with serious comorbidity and body mass index (BMI) of 40.0 to 44.9 in adult [facility]) Continue healthy dietary habits and limiting portions. Discussed using food diary application to help with portion control and education. Will start adding some exercise once she loses another 10 pounds or so.  - CBC w/Diff/Platelet - Comprehensive Metabolic Panel (CMET) - T4 AND TSH - Hepatitis C Antibody - HIV antibody (with reflex) - Lipid Panel With LDL/HDL Ratio - HgB A1c - ANA,IFA RA Diag Pnl w/rflx Tit/Patn  4. Weight gain See above medical treatment plan. - CBC w/Diff/Platelet - Comprehensive Metabolic Panel (CMET) - T4 AND TSH - Hepatitis C Antibody - HIV antibody (with reflex) - Lipid Panel With LDL/HDL Ratio - HgB A1c - ANA,IFA RA Diag Pnl w/rflx Tit/Patn  5. Arthralgia, unspecified joint Noted. Thought to be secondary to weight gain. Call if worsening.  6. Depression, recurrent (HCC) Stable on Citalopram 20mg .  --------------------------------------------------------------------    Mar Daring, PA-C  [facility]

## 2019-08-26 ENCOUNTER — Encounter: Payer: Self-pay | Admitting: Physician Assistant

## 2019-08-26 ENCOUNTER — Other Ambulatory Visit: Payer: Self-pay

## 2019-08-26 ENCOUNTER — Ambulatory Visit: Payer: Managed Care, Other (non HMO) | Admitting: Physician Assistant

## 2019-08-26 VITALS — BP 175/91 | HR 111 | Temp 97.6°F | Resp 16 | Ht 62.0 in | Wt 225.2 lb

## 2019-08-26 DIAGNOSIS — E7800 Pure hypercholesterolemia, unspecified: Secondary | ICD-10-CM | POA: Diagnosis not present

## 2019-08-26 DIAGNOSIS — R635 Abnormal weight gain: Secondary | ICD-10-CM | POA: Diagnosis not present

## 2019-08-26 DIAGNOSIS — M2550 Pain in unspecified joint: Secondary | ICD-10-CM

## 2019-08-26 DIAGNOSIS — Z6841 Body Mass Index (BMI) 40.0 and over, adult: Secondary | ICD-10-CM

## 2019-08-26 DIAGNOSIS — I10 Essential (primary) hypertension: Secondary | ICD-10-CM | POA: Diagnosis not present

## 2019-08-26 DIAGNOSIS — F339 Major depressive disorder, recurrent, unspecified: Secondary | ICD-10-CM

## 2019-08-30 ENCOUNTER — Encounter: Payer: Self-pay | Admitting: Physician Assistant

## 2019-08-30 DIAGNOSIS — F339 Major depressive disorder, recurrent, unspecified: Secondary | ICD-10-CM | POA: Insufficient documentation

## 2019-08-30 DIAGNOSIS — E7800 Pure hypercholesterolemia, unspecified: Secondary | ICD-10-CM | POA: Insufficient documentation

## 2019-08-30 NOTE — Patient Instructions (Signed)

## 2020-03-07 ENCOUNTER — Other Ambulatory Visit: Payer: Self-pay | Admitting: Obstetrics and Gynecology

## 2020-03-07 DIAGNOSIS — Z1231 Encounter for screening mammogram for malignant neoplasm of breast: Secondary | ICD-10-CM

## 2020-04-11 ENCOUNTER — Other Ambulatory Visit: Payer: Self-pay

## 2020-04-11 ENCOUNTER — Ambulatory Visit
Admission: RE | Admit: 2020-04-11 | Discharge: 2020-04-11 | Disposition: A | Discharge status: Home / Self Care | Payer: Managed Care, Other (non HMO) | Source: Ambulatory Visit | Attending: Obstetrics and Gynecology | Admitting: Obstetrics and Gynecology

## 2020-04-11 DIAGNOSIS — Z1231 Encounter for screening mammogram for malignant neoplasm of breast: Secondary | ICD-10-CM | POA: Insufficient documentation

## 2020-04-13 ENCOUNTER — Ambulatory Visit
Admission: RE | Admit: 2020-04-13 | Payer: PRIVATE HEALTH INSURANCE | Source: Home / Self Care | Admitting: Ophthalmology

## 2020-04-13 ENCOUNTER — Encounter: Admission: RE | Payer: Self-pay | Source: Home / Self Care

## 2020-04-13 SURGERY — BLEPHAROPLASTY
Anesthesia: Monitor Anesthesia Care | Laterality: Bilateral

## 2020-09-25 ENCOUNTER — Encounter: Payer: Self-pay | Admitting: Physician Assistant

## 2020-09-25 ENCOUNTER — Telehealth (INDEPENDENT_AMBULATORY_CARE_PROVIDER_SITE_OTHER): Payer: Managed Care, Other (non HMO) | Admitting: Physician Assistant

## 2020-09-25 DIAGNOSIS — J0140 Acute pansinusitis, unspecified: Secondary | ICD-10-CM | POA: Diagnosis not present

## 2020-09-25 MED ORDER — AMOXICILLIN-POT CLAVULANATE 875-125 MG PO TABS: 1.0000 | ORAL_TABLET | Freq: Two times a day (BID) | ORAL | 0 refills | Status: DC

## 2020-09-25 NOTE — Progress Notes (Signed)
MyChart Video Visit    Virtual Visit via Video Note   This visit type was conducted due to national recommendations for restrictions regarding the COVID-19 Pandemic (e.g. social distancing) in an effort to limit this patient's exposure and mitigate transmission in our community. This patient is at least at moderate risk for complications without adequate follow up. This format is felt to be most appropriate for this patient at this time. Physical exam was limited by quality of the video and audio technology used for the visit.   Patient location: Home Provider location: [facility]  I discussed the limitations of evaluation and management by telemedicine and the availability of in person appointments. The patient expressed understanding and agreed to proceed.  Patient: Sherry Kelly   DOB: 10/17/1960   60 y.o. Female  MRN: [id number] Visit Date: 09/25/2020  Today's healthcare provider: Margaretann Loveless, PA-C   Chief Complaint  Patient presents with  . Sinusitis   Subjective    Sinusitis This is a new problem. The current episode started in the past 7 days (3-4 days ago). The problem has been gradually worsening since onset. There has been no fever. The pain is mild. Associated symptoms include congestion, coughing, headaches, sinus pressure (worse on right side) and sneezing. Pertinent negatives include no chills, diaphoresis, ear pain, shortness of breath or sore throat. Past treatments include oral decongestants and acetaminophen. The treatment provided no relief.    Has had 2 covid test that are negative.  Patient Active Problem List   Diagnosis Date Noted  . Hypercholesterolemia 08/30/2019  . Depression, recurrent (HCC) 08/30/2019  . Strain of thoracic back region 07/10/2016  . H/O sinus tachycardia 10/25/2015  . Hypertension 12/09/2013   Past Medical History:  Diagnosis Date  . Hypertension    Social History   Tobacco Use  . Smoking status: Never Smoker  . Smokeless  tobacco: Never Used  Substance Use Topics  . Alcohol use: Not Currently    Alcohol/week: 0.0 standard drinks  . Drug use: Never   No Known Allergies  Medications: Outpatient Medications Prior to Visit  Medication Sig  . aspirin 81 MG EC tablet aspirin 81 mg tablet,delayed release  Take 1 tablet every day by oral route.  . citalopram (CELEXA) 20 MG tablet Take by mouth.  . cyanocobalamin (,VITAMIN B-12,) 1000 MCG/ML injection Inject 1,000 mcg into the muscle every 30 (thirty) days.  . irbesartan (AVAPRO) 150 MG tablet irbesartan 150 mg tablet  . Omega-3 Fatty Acids (FISH OIL PO) Take by mouth.  Marland Kitchen UNABLE TO FIND Med Name: cbd oil  . simvastatin (ZOCOR) 20 MG tablet Take by mouth.   No facility-administered medications prior to visit.    Review of Systems  Constitutional: Positive for fatigue. Negative for activity change, appetite change, chills, diaphoresis, fever and unexpected weight change.  HENT: Positive for congestion, postnasal drip, rhinorrhea, sinus pressure (worse on right side), sinus pain and sneezing. Negative for ear discharge, ear pain, hearing loss, sore throat, tinnitus and trouble swallowing.   Eyes: Negative.   Respiratory: Positive for cough. Negative for apnea, choking, chest tightness, shortness of breath, wheezing and stridor.   Cardiovascular: Negative.   Gastrointestinal: Negative.   Neurological: Positive for headaches. Negative for dizziness and light-headedness.      Objective    There were no vitals taken for this visit.   Physical Exam Vitals reviewed.  Constitutional:      General: She is not in acute distress.    Appearance:  Normal appearance. She is well-developed and well-nourished. She is not ill-appearing.  HENT:     Head: Normocephalic and atraumatic.     Nose:     Comments: Tender to palpation over right maxillary sinus Eyes:     Extraocular Movements: EOM normal.  Pulmonary:     Effort: Pulmonary effort is normal. No respiratory  distress.  Musculoskeletal:     Cervical back: Normal range of motion and neck supple.  Neurological:     Mental Status: She is alert.  Psychiatric:        Mood and Affect: Mood and affect normal.        Behavior: Behavior normal.        Thought Content: Thought content normal.        Judgment: Judgment normal.        Assessment & Plan     1. Acute non-recurrent pansinusitis Worsening symptoms that have not responded to OTC medications. Will give augmentin as below. Continue allergy medications. Stay well hydrated and get plenty of rest. Call if no symptom improvement or if symptoms worsen. - amoxicillin-clavulanate (AUGMENTIN) 875-125 MG tablet; Take 1 tablet by mouth 2 (two) times daily.  Dispense: 20 tablet; Refill: 0   No follow-ups on file.     I discussed the assessment and treatment plan with the patient. The patient was provided an opportunity to ask questions and all were answered. The patient agreed with the plan and demonstrated an understanding of the instructions.   The patient was advised to call back or seek an in-person evaluation if the symptoms worsen or if the condition fails to improve as anticipated.  I provided 6 minutes of face-to-face time during this encounter via MyChart Video enabled encounter.  Delmer Islam, PA-C, have reviewed all documentation for this visit. The documentation on 09/25/20 for the exam, diagnosis, procedures, and orders are all accurate and complete.  Reine Just [facility] [phone] (phone) [phone] (fax)  [facility]

## 2021-01-31 IMAGING — MG DIGITAL SCREENING BILAT W/ TOMO W/ CAD
6 of 10 series · 6 of 30 positions shown · non-contrast
Comparison: Previous exam(s).

CLINICAL DATA: Screening.

EXAM:
DIGITAL SCREENING BILATERAL MAMMOGRAM WITH TOMO AND CAD

[R MLO synth-2D]
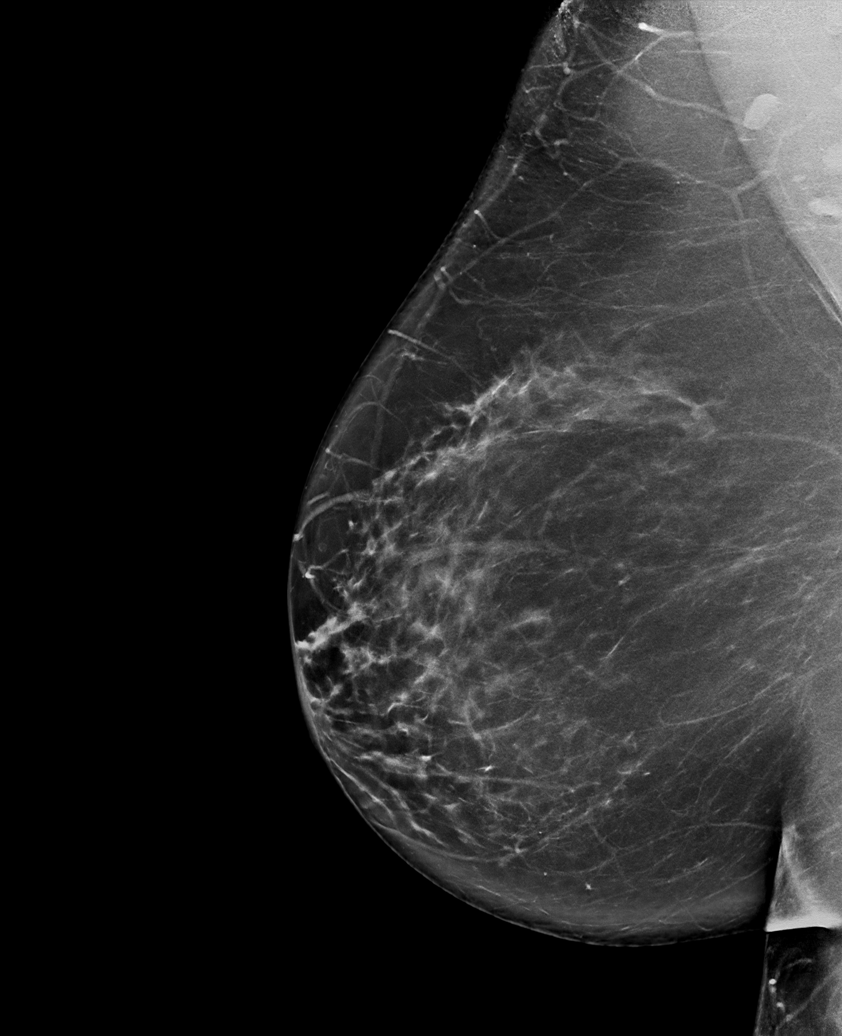

[R CC synth-2D]
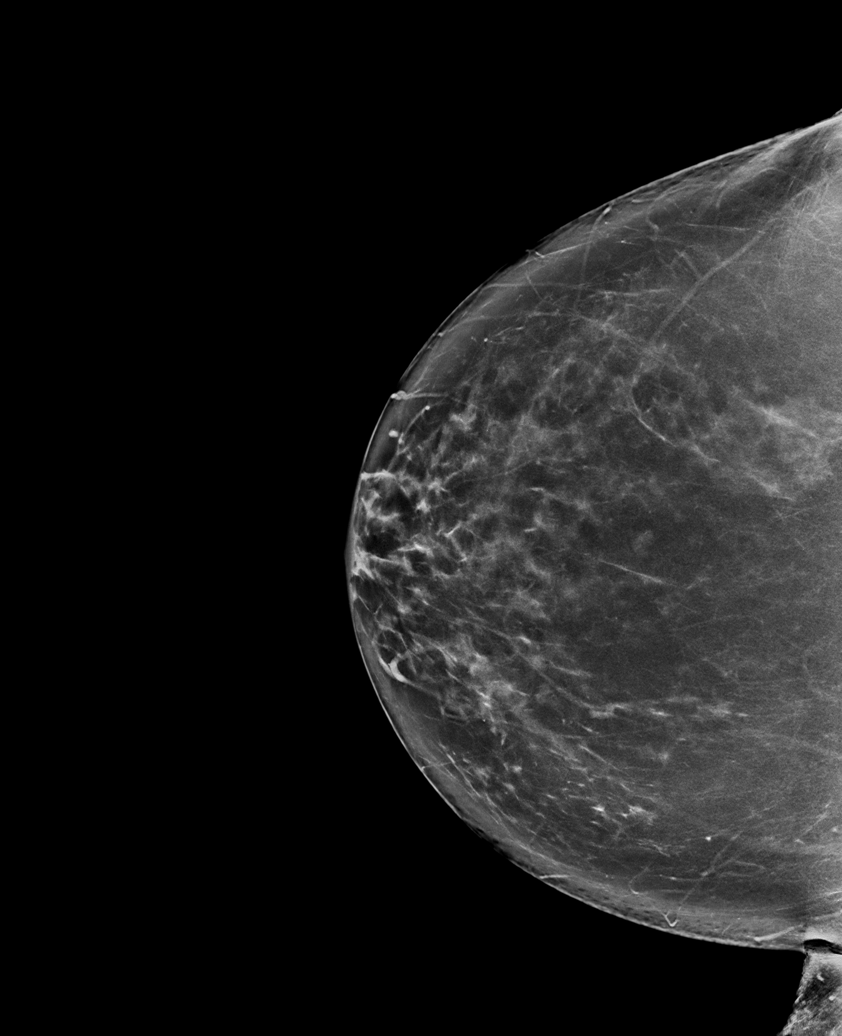

[L CC synth-2D]
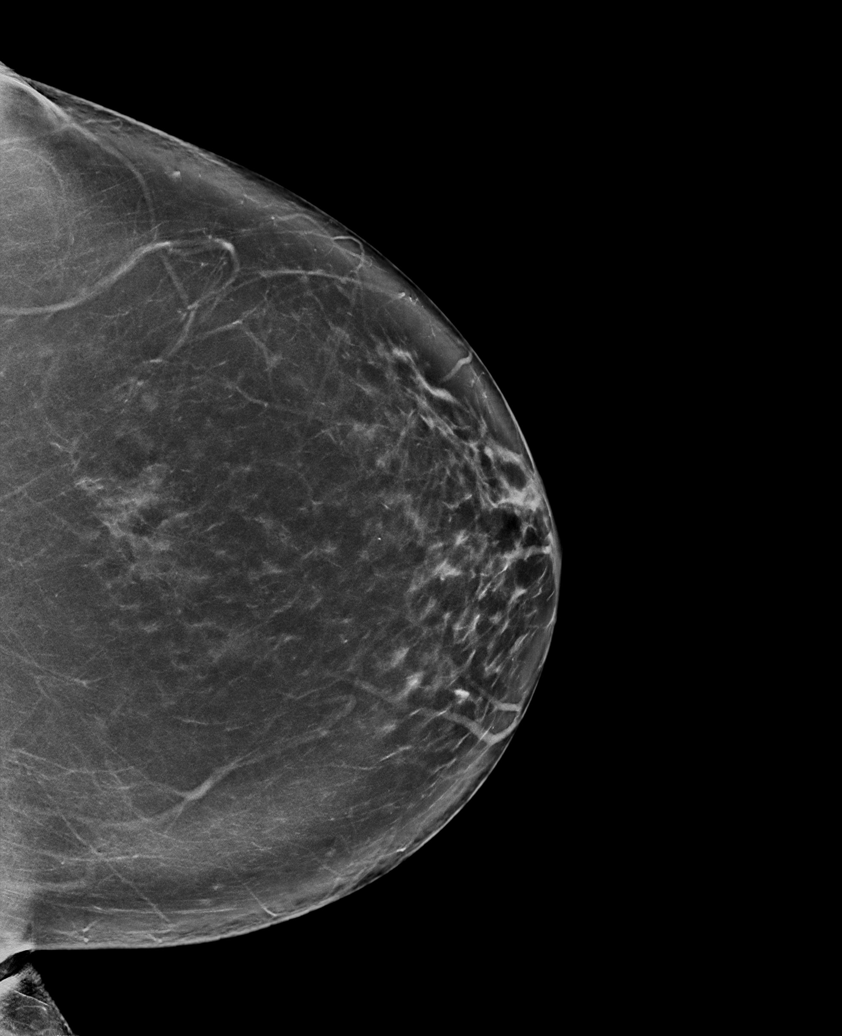

[L MLO synth-2D]
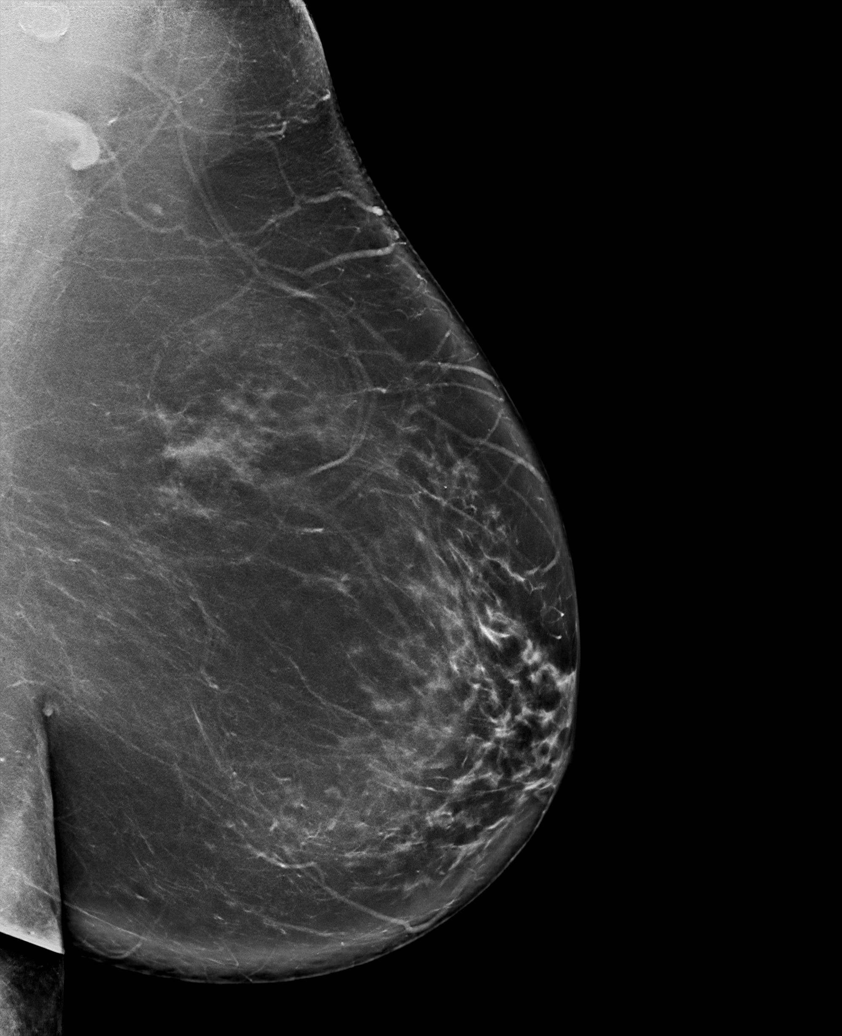

[R AT synth-2D]
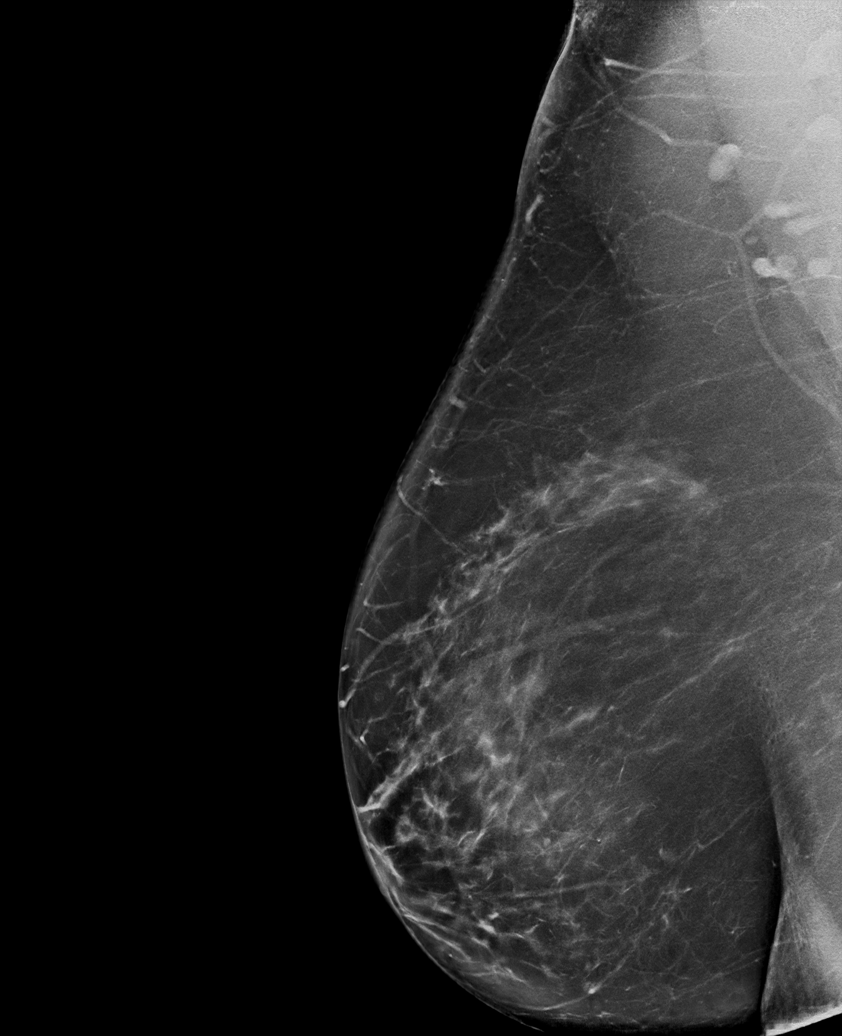

[R MLO tomo · tomo slice 47/94.0]
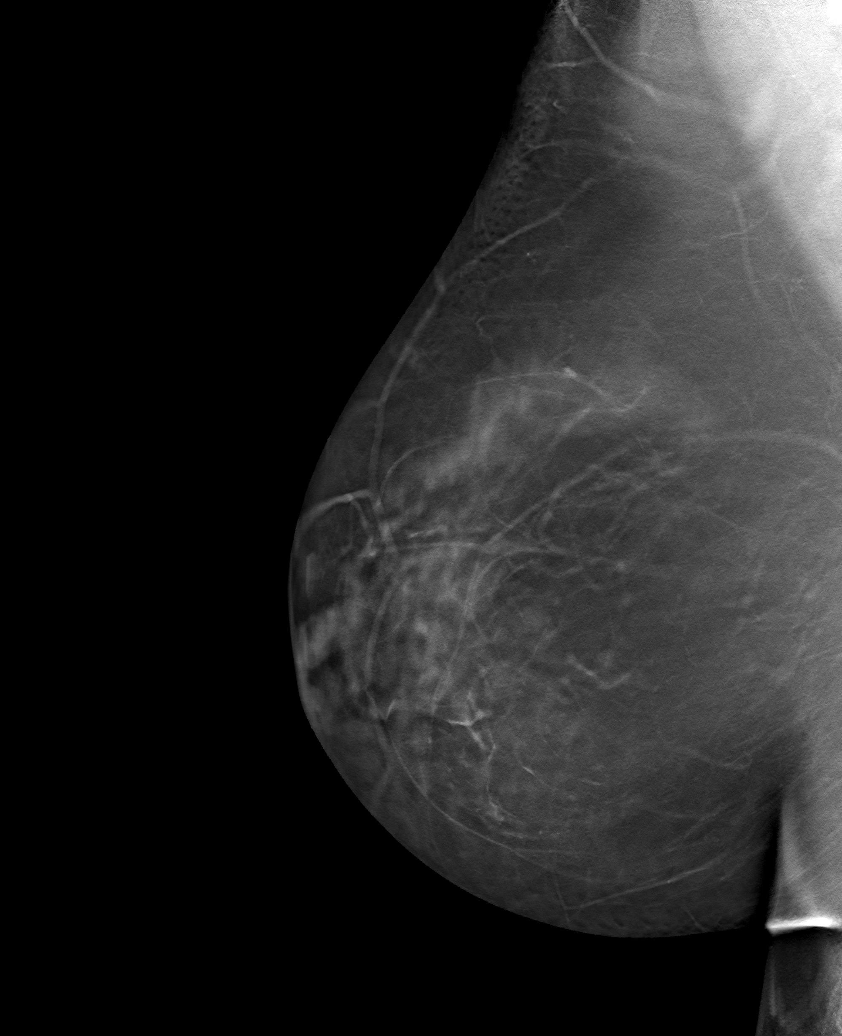

[6 of 30 positions shown; findings below may reference images not displayed]

ACR Breast Density Category b: There are scattered areas of
fibroglandular density.
FINDINGS: There are no findings suspicious for malignancy. Images were
processed with CAD.
IMPRESSION: No mammographic evidence of malignancy. A result letter of this
screening mammogram will be mailed directly to the patient.

RECOMMENDATION:
Screening mammogram in one year. (Code:CN-U-775)

BI-RADS CATEGORY  1: Negative.

## 2021-03-12 ENCOUNTER — Other Ambulatory Visit: Payer: Self-pay | Admitting: Obstetrics and Gynecology

## 2021-03-12 DIAGNOSIS — Z1231 Encounter for screening mammogram for malignant neoplasm of breast: Secondary | ICD-10-CM

## 2021-04-10 ENCOUNTER — Ambulatory Visit
Admission: EM | Admit: 2021-04-10 | Discharge: 2021-04-10 | Disposition: A | Discharge status: Home / Self Care | Payer: Managed Care, Other (non HMO) | Attending: Emergency Medicine | Admitting: Emergency Medicine

## 2021-04-10 ENCOUNTER — Encounter: Payer: Self-pay | Admitting: Emergency Medicine

## 2021-04-10 ENCOUNTER — Other Ambulatory Visit: Payer: Self-pay

## 2021-04-10 DIAGNOSIS — U071 COVID-19: Secondary | ICD-10-CM

## 2021-04-10 MED ORDER — BENZONATATE 100 MG PO CAPS: 100.0000 mg | ORAL_CAPSULE | Freq: Three times a day (TID) | ORAL | 0 refills | Status: DC | PRN

## 2021-04-10 NOTE — ED Triage Notes (Signed)
Pt tested positive with at home test 1 day ago after her husband had COVID last week. Has nasal congestion and drainage and is inquiring about antiviral treatment.

## 2021-04-10 NOTE — ED Provider Notes (Signed)
[facility]    CSN: [id number] Arrival date & time: 04/10/21  1514      History   Chief Complaint Chief Complaint  Patient presents with   Nasal Congestion   Covid Positive    HPI Sherry Kelly is a 60 y.o. female.  Patient presents with congestion, postnasal drip, cough since yesterday.  She tested positive for COVID at home yesterday.  Her husband had COVID last week.  She denies fever, chills, rash, sore throat, shortness of breath, or other symptoms.  Treatment attempted at home with Tylenol and Mucinex.  Her medical history includes hypertension.  The history is provided by the patient and medical records.   Past Medical History:  Diagnosis Date   Hypertension     Patient Active Problem List   Diagnosis Date Noted   Hypercholesterolemia 08/30/2019   Depression, recurrent (HCC) 08/30/2019   Strain of thoracic back region 07/10/2016   H/O sinus tachycardia 10/25/2015   Hypertension 12/09/2013    Past Surgical History:  Procedure Laterality Date   BREAST EXCISIONAL BIOPSY Left 1984   benign    OB History   No obstetric history on file.      Home Medications    Prior to Admission medications   Medication Sig Start Date End Date Taking? Authorizing Provider  benzonatate (TESSALON) 100 MG capsule Take 1 capsule (100 mg total) by mouth 3 (three) times daily as needed for cough. 04/10/21  Yes Mickie Bail, NP  citalopram (CELEXA) 20 MG tablet Take by mouth. 02/19/17  Yes [provider]  irbesartan (AVAPRO) 150 MG tablet irbesartan 150 mg tablet 11/30/17  Yes [provider]  simvastatin (ZOCOR) 20 MG tablet Take by mouth. 03/30/19 04/10/21 Yes [provider]  amoxicillin-clavulanate (AUGMENTIN) 875-125 MG tablet Take 1 tablet by mouth 2 (two) times daily. 09/25/20   Margaretann Loveless, PA-C  aspirin 81 MG EC tablet aspirin 81 mg tablet,delayed release  Take 1 tablet every day by oral route.    [provider]   cyanocobalamin (,VITAMIN B-12,) 1000 MCG/ML injection Inject 1,000 mcg into the muscle every 30 (thirty) days. 06/24/20   [provider]  Omega-3 Fatty Acids (FISH OIL PO) Take by mouth.    [provider]  UNABLE TO FIND Med Name: cbd oil    [provider]    Family History Family History  Problem Relation Age of Onset   Hypertension Mother    Diabetes Mother    Kidney disease Mother    Heart disease Father    Breast cancer Neg Hx     Social History Social History   Tobacco Use   Smoking status: Never   Smokeless tobacco: Never  Substance Use Topics   Alcohol use: Not Currently    Alcohol/week: 0.0 standard drinks   Drug use: Never     Allergies   Patient has no known allergies.   Review of Systems Review of Systems  Constitutional:  Negative for chills and fever.  HENT:  Positive for congestion, postnasal drip and rhinorrhea. Negative for ear pain and sore throat.   Respiratory:  Positive for cough. Negative for shortness of breath.   Cardiovascular:  Negative for chest pain and palpitations.  Gastrointestinal:  Negative for abdominal pain, diarrhea and vomiting.  Skin:  Negative for color change and rash.  All other systems reviewed and are negative.   Physical Exam Triage Vital Signs ED Triage Vitals  Enc Vitals Group  BP      Pulse      Resp      Temp      Temp src      SpO2      Weight      Height      Head Circumference      Peak Flow      Pain Score      Pain Loc      Pain Edu?      Excl. in GC?    No data found.  Updated Vital Signs BP 136/69 (BP Location: Left Arm)   Pulse (!) 105   Temp 98.2 F (36.8 C) (Oral)   Resp 18   SpO2 98%   Visual Acuity Right Eye Distance:   Left Eye Distance:   Bilateral Distance:    Right Eye Near:   Left Eye Near:    Bilateral Near:     Physical Exam Vitals and nursing note reviewed.  Constitutional:      General: She is not in acute distress.    Appearance:  She is well-developed. She is obese. She is not ill-appearing.  HENT:     Head: Normocephalic and atraumatic.     Right Ear: Tympanic membrane normal.     Left Ear: Tympanic membrane normal.     Nose: Congestion and rhinorrhea present.     Mouth/Throat:     Mouth: Mucous membranes are moist.     Pharynx: Oropharynx is clear.  Eyes:     Conjunctiva/sclera: Conjunctivae normal.  Cardiovascular:     Rate and Rhythm: Normal rate and regular rhythm.     Heart sounds: Normal heart sounds.  Pulmonary:     Effort: Pulmonary effort is normal. No respiratory distress.     Breath sounds: Normal breath sounds.  Abdominal:     Palpations: Abdomen is soft.     Tenderness: There is no abdominal tenderness.  Musculoskeletal:     Cervical back: Neck supple.  Skin:    General: Skin is warm and dry.  Neurological:     General: No focal deficit present.     Mental Status: She is alert and oriented to person, place, and time.     Gait: Gait normal.  Psychiatric:        Mood and Affect: Mood normal.        Behavior: Behavior normal.     UC Treatments / Results  Labs (all labs ordered are listed, but only abnormal results are displayed) Labs Reviewed - No data to display  EKG   Radiology No results found.  Procedures Procedures (including critical care time)  Medications Ordered in UC Medications - No data to display  Initial Impression / Assessment and Plan / UC Course  I have reviewed the triage vital signs and the nursing notes.  Pertinent labs & imaging results that were available during my care of the patient were reviewed by me and considered in my medical decision making (see chart for details).  COVID-19.  Patient declines treatment with COVID antiviral.  Discussed symptomatic treatment including Tylenol or ibuprofen, Mucinex, rest, hydration.  Treating cough with Tessalon Perles.  ED precautions discussed.  Discussed CDC quarantine guidelines.  Instructed patient to  follow-up with her PCP if her symptoms are not improving.  She agrees to plan of care.   Final Clinical Impressions(s) / UC Diagnoses   Final diagnoses:  COVID-19     Discharge Instructions      Take Tylenol or  ibuprofen as needed for fever or discomfort.  Take plain Mucinex as needed for congestion.    Take the prescribed Tessalon Perles as needed for cough.    Go to the emergency department if you have shortness of breath or other concerning symptoms.    Follow up with your primary care provider if your symptoms are not improving.         ED Prescriptions     Medication Sig Dispense Auth. Provider   benzonatate (TESSALON) 100 MG capsule Take 1 capsule (100 mg total) by mouth 3 (three) times daily as needed for cough. 21 capsule Mickie Bail, NP      PDMP not reviewed this encounter.   Mickie Bail, NP 04/10/21 [phone]

## 2021-04-10 NOTE — Discharge Instructions (Addendum)
Take Tylenol or ibuprofen as needed for fever or discomfort.  Take plain Mucinex as needed for congestion.    Take the prescribed Tessalon Perles as needed for cough.    Go to the emergency department if you have shortness of breath or other concerning symptoms.    Follow up with your primary care provider if your symptoms are not improving.

## 2021-05-02 ENCOUNTER — Ambulatory Visit
Admission: RE | Admit: 2021-05-02 | Discharge: 2021-05-02 | Disposition: A | Discharge status: Home / Self Care | Payer: Managed Care, Other (non HMO) | Source: Ambulatory Visit | Attending: Obstetrics and Gynecology | Admitting: Obstetrics and Gynecology

## 2021-05-02 ENCOUNTER — Other Ambulatory Visit: Payer: Self-pay

## 2021-05-02 DIAGNOSIS — Z1231 Encounter for screening mammogram for malignant neoplasm of breast: Secondary | ICD-10-CM | POA: Insufficient documentation

## 2022-05-19 ENCOUNTER — Other Ambulatory Visit: Payer: Self-pay | Admitting: Obstetrics and Gynecology

## 2022-05-19 DIAGNOSIS — Z1231 Encounter for screening mammogram for malignant neoplasm of breast: Secondary | ICD-10-CM

## 2022-06-25 ENCOUNTER — Ambulatory Visit
Admission: RE | Admit: 2022-06-25 | Discharge: 2022-06-25 | Disposition: A | Discharge status: Home / Self Care | Payer: Managed Care, Other (non HMO) | Source: Ambulatory Visit | Attending: Obstetrics and Gynecology | Admitting: Obstetrics and Gynecology

## 2022-06-25 DIAGNOSIS — Z1231 Encounter for screening mammogram for malignant neoplasm of breast: Secondary | ICD-10-CM | POA: Insufficient documentation

## 2023-04-14 ENCOUNTER — Encounter: Payer: Self-pay | Admitting: Dermatology

## 2023-04-14 ENCOUNTER — Ambulatory Visit: Payer: Managed Care, Other (non HMO) | Admitting: Dermatology

## 2023-04-14 VITALS — BP 148/74 | HR 80

## 2023-04-14 DIAGNOSIS — L820 Inflamed seborrheic keratosis: Secondary | ICD-10-CM

## 2023-04-14 DIAGNOSIS — L570 Actinic keratosis: Secondary | ICD-10-CM

## 2023-04-14 DIAGNOSIS — S40811A Abrasion of right upper arm, initial encounter: Secondary | ICD-10-CM | POA: Diagnosis not present

## 2023-04-14 DIAGNOSIS — L80 Vitiligo: Secondary | ICD-10-CM

## 2023-04-14 DIAGNOSIS — L814 Other melanin hyperpigmentation: Secondary | ICD-10-CM

## 2023-04-14 DIAGNOSIS — W908XXA Exposure to other nonionizing radiation, initial encounter: Secondary | ICD-10-CM

## 2023-04-14 DIAGNOSIS — M67441 Ganglion, right hand: Secondary | ICD-10-CM

## 2023-04-14 DIAGNOSIS — D2371 Other benign neoplasm of skin of right lower limb, including hip: Secondary | ICD-10-CM

## 2023-04-14 DIAGNOSIS — D229 Melanocytic nevi, unspecified: Secondary | ICD-10-CM

## 2023-04-14 DIAGNOSIS — L821 Other seborrheic keratosis: Secondary | ICD-10-CM

## 2023-04-14 DIAGNOSIS — D239 Other benign neoplasm of skin, unspecified: Secondary | ICD-10-CM

## 2023-04-14 DIAGNOSIS — Z1283 Encounter for screening for malignant neoplasm of skin: Secondary | ICD-10-CM

## 2023-04-14 DIAGNOSIS — L578 Other skin changes due to chronic exposure to nonionizing radiation: Secondary | ICD-10-CM

## 2023-04-14 NOTE — Progress Notes (Signed)
Follow-Up Visit   Subjective  Sherry Kelly is a 62 y.o. female who presents for the following: Skin Cancer Screening and Full Body Skin Exam Spot at forehead itchy and will not go away and on right thigh too.   The patient presents for Total-Body Skin Exam (TBSE) for skin cancer screening and mole check. The patient has spots, moles and lesions to be evaluated, some may be new or changing and the patient may have concern these could be cancer.    The following portions of the chart were reviewed this encounter and updated as appropriate: medications, allergies, medical history  Review of Systems:  No other skin or systemic complaints except as noted in HPI or Assessment and Plan.  Objective  Well appearing patient in no apparent distress; mood and affect are within normal limits.  A full examination was performed including scalp, head, eyes, ears, nose, lips, neck, chest, axillae, abdomen, back, buttocks, bilateral upper extremities, bilateral lower extremities, hands, feet, fingers, toes, fingernails, and toenails. All findings within normal limits unless otherwise noted below.   Relevant physical exam findings are noted in the Assessment and Plan.  right lateral forehead x 1, left sternum x 1, right chest x 3 (5) Erythematous thin papules/macules with gritty scale.   right upper anterior thigh x1 Erythematous stuck-on, waxy papule    Assessment & Plan   DIGITAL MUCOUS CYST  Exam: 3 mm translucent flesh papule at  right index prox nail fold   A digital mucous cyst also known as a myxoid cyst or pseudocyst is a ganglion cyst arising from the distal interphalangeal (DIP) joint of the finger or thumb (or, less commonly, toe). The cysts are believed to form from degeneration of connective tissue and are associated with osteoarthritic joints or injury. Although the exact etiology is unknown, it is likely that a small tear forms in a joint capsule or tendon sheath, allowing  extravasation of synovial fluid into the adjacent tissue. When the fluid reacts with local tissue, it becomes more gelatinous and a cyst wall forms. With any treatment, there is a high rate of recurrence.   Treatment options include: - Puncture / Incision & Drainage (I&D) - Intralesional steroid injection - Intralesional Sclerosant injection (Asclera/ Polidocanol) - Intralesional steroid + sclerosant - Corticosteroid tape - Cryosurgery - Laser (CO2) - Infrared photocoagulation - Excision / Surgery  Treatment Plan: Benign. Observe   Patient is not bothered by and defers any treatment     SKIN CANCER SCREENING PERFORMED TODAY.  VITILIGO Exam: depigmented macule patches on mid back , lower back, and left flank  Vitiligo is a chronic autoimmune condition which causes loss of skin pigment and is commonly seen on the face and may also involve areas of trauma like hands, elbows, knees, and ankles. There is no cure and it is difficult to treat.  Treatments include topical steroids and other topical anti-inflammatory ointments/creams and topical and oral Jak inhibitors.  Sometimes narrow band UV light therapy or Xtrac laser is helpful, both of which require twice weekly treatments for at least 3-6 months.  Antioxidant vitamins, such as Vitamins A,C,E,D, Folic Acid and B12 may be added to enhance treatment. Heliocare may also enhance treatment results.  Treatment Plan: Benign. Observe   Patient is not bothered by and defers treatment  Continue photoprotection  Healing EXCORIATION Exam:  crusted papule at right upper arm   Treatment Plan: Recommend vaseline. Call if not resolving.  DERMATOFIBROMA Exam: Firm pink/brown papulenodule with dimple sign.  At right medial calf  Treatment Plan: A dermatofibroma is a benign growth possibly related to trauma, such as an insect bite, cut from shaving, or inflamed acne-type bump.  Treatment options to remove include shave or excision with resulting  scar and risk of recurrence.  Since benign-appearing and not bothersome, will observe for now.    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  - Staying in the shade or wearing long sleeves, sun glasses (UVA+UVB protection) and wide brim hats (4-inch brim around the entire circumference of the hat) are also recommended for sun protection.  - Call for new or changing lesions.  LENTIGINES, SEBORRHEIC KERATOSES, HEMANGIOMAS - Benign normal skin lesions - Benign-appearing - Call for any changes  MELANOCYTIC NEVI - Tan-brown and/or pink-flesh-colored symmetric macules and papules - Benign appearing on exam today - Observation - Call clinic for new or changing moles - Recommend daily use of broad spectrum spf 30+ sunscreen to sun-exposed areas.   Actinic keratosis (5) right lateral forehead x 1, left sternum x 1, right chest x 3  Actinic keratoses are precancerous spots that appear secondary to cumulative UV radiation exposure/sun exposure over time. They are chronic with expected duration over 1 year. A portion of actinic keratoses will progress to squamous cell carcinoma of the skin. It is not possible to reliably predict which spots will progress to skin cancer and so treatment is recommended to prevent development of skin cancer.  Recommend daily broad spectrum sunscreen SPF 30+ to sun-exposed areas, reapply every 2 hours as needed.  Recommend staying in the shade or wearing long sleeves, sun glasses (UVA+UVB protection) and wide brim hats (4-inch brim around the entire circumference of the hat). Call for new or changing lesions.  Destruction of lesion - right lateral forehead x 1, left sternum x 1, right chest x 3 (5)  Destruction method: cryotherapy   Informed consent:  discussed and consent obtained   Lesion destroyed using liquid nitrogen: Yes   Region frozen until ice ball extended beyond lesion: Yes   Outcome: patient tolerated procedure well with no complications   Post-procedure details: wound care instructions given   Additional details:  Prior to procedure, discussed risks of blister formation, small wound, skin dyspigmentation, or rare scar following cryotherapy. Recommend Vaseline ointment to treated areas while healing.   Inflamed seborrheic keratosis right upper anterior thigh x1  Symptomatic, irritating, patient would like treated.  Destruction of lesion - right upper anterior thigh x1  Destruction method: cryotherapy   Informed consent: discussed and consent obtained   Lesion destroyed using liquid nitrogen: Yes   Region frozen until ice ball extended beyond lesion: Yes   Outcome: patient tolerated procedure well with no complications   Post-procedure details: wound care instructions given   Additional details:  Prior to procedure, discussed risks of blister formation, small wound, skin dyspigmentation, or rare scar following cryotherapy. Recommend Vaseline ointment to treated areas while healing.    Return in about 1 year (around 04/13/2024) for TBSE.  I, Asher Muir, CMA, am acting as scribe for Willeen Niece, MD.   Documentation: I have reviewed the above documentation for accuracy and completeness, and I agree with the above.  Willeen Niece, MD

## 2023-04-14 NOTE — Patient Instructions (Addendum)
At back and left side Vitiligo is a chronic autoimmune condition which causes loss of skin pigment and is commonly seen on the face and may also involve areas of trauma like hands, elbows, knees, and ankles. There is no cure and it is difficult to treat.  Treatments include topical steroids and other topical anti-inflammatory ointments/creams and topical and oral Jak inhibitors.  Sometimes narrow band UV light therapy or Xtrac laser is helpful, both of which require twice weekly treatments for at least 3-6 months.  Antioxidant vitamins, such as Vitamins C,E,D, Folic Acid, B12, and Alpha Lipoic Acid may be added to enhance treatment. Heliocare may also enhance treatment results.  At right index finger   digital mucous cyst also known as a myxoid cyst or pseudocyst is a ganglion cyst arising from the distal interphalangeal (DIP) joint of the finger or thumb (or, less commonly, toe). The cysts are believed to form from degeneration of connective tissue and are associated with osteoarthritic joints or injury. Although the exact etiology is unknown, it is likely that a small tear forms in a joint capsule or tendon sheath, allowing extravasation of synovial fluid into the adjacent tissue. When the fluid reacts with local tissue, it becomes more gelatinous and a cyst wall forms.    Actinic keratoses are precancerous spots that appear secondary to cumulative UV radiation exposure/sun exposure over time. They are chronic with expected duration over 1 year. A portion of actinic keratoses will progress to squamous cell carcinoma of the skin. It is not possible to reliably predict which spots will progress to skin cancer and so treatment is recommended to prevent development of skin cancer.  Recommend daily broad spectrum sunscreen SPF 30+ to sun-exposed areas, reapply every 2 hours as needed.  Recommend staying in the shade or wearing long sleeves, sun glasses (UVA+UVB protection) and wide brim hats (4-inch brim  around the entire circumference of the hat). Call for new or changing lesions.   Cryotherapy Aftercare  Wash gently with soap and water everyday.   Apply Vaseline and Band-Aid daily until healed.   Seborrheic Keratosis  What causes seborrheic keratoses? Seborrheic keratoses are harmless, common skin growths that first appear during adult life.  As time goes by, more growths appear.  Some people may develop a large number of them.  Seborrheic keratoses appear on both covered and uncovered body parts.  They are not caused by sunlight.  The tendency to develop seborrheic keratoses can be inherited.  They vary in color from skin-colored to gray, brown, or even black.  They can be either smooth or have a rough, warty surface.   Seborrheic keratoses are superficial and look as if they were stuck on the skin.  Under the microscope this type of keratosis looks like layers upon layers of skin.  That is why at times the top layer may seem to fall off, but the rest of the growth remains and re-grows.    Treatment Seborrheic keratoses do not need to be treated, but can easily be removed in the office.  Seborrheic keratoses often cause symptoms when they rub on clothing or jewelry.  Lesions can be in the way of shaving.  If they become inflamed, they can cause itching, soreness, or burning.  Removal of a seborrheic keratosis can be accomplished by freezing, burning, or surgery. If any spot bleeds, scabs, or grows rapidly, please return to have it checked, as these can be an indication of a skin cancer.      Melanoma  ABCDEs  Melanoma is the most dangerous type of skin cancer, and is the leading cause of death from skin disease.  You are more likely to develop melanoma if you: Have light-colored skin, light-colored eyes, or red or blond hair Spend a lot of time in the sun Tan regularly, either outdoors or in a tanning bed Have had blistering sunburns, especially during childhood Have a close family  member who has had a melanoma Have atypical moles or large birthmarks  Early detection of melanoma is key since treatment is typically straightforward and cure rates are extremely high if we catch it early.   The first sign of melanoma is often a change in a mole or a new dark spot.  The ABCDE system is a way of remembering the signs of melanoma.  A for asymmetry:  The two halves do not match. B for border:  The edges of the growth are irregular. C for color:  A mixture of colors are present instead of an even brown color. D for diameter:  Melanomas are usually (but not always) greater than 6mm - the size of a pencil eraser. E for evolution:  The spot keeps changing in size, shape, and color.  Please check your skin once per month between visits. You can use a small mirror in front and a large mirror behind you to keep an eye on the back side or your body.   If you see any new or changing lesions before your next follow-up, please call to schedule a visit.  Please continue daily skin protection including broad spectrum sunscreen SPF 30+ to sun-exposed areas, reapplying every 2 hours as needed when you're outdoors.   Staying in the shade or wearing long sleeves, sun glasses (UVA+UVB protection) and wide brim hats (4-inch brim around the entire circumference of the hat) are also recommended for sun protection.     Due to recent changes in healthcare laws, you may see results of your pathology and/or laboratory studies on MyChart before the doctors have had a chance to review them. We understand that in some cases there may be results that are confusing or concerning to you. Please understand that not all results are received at the same time and often the doctors may need to interpret multiple results in order to provide you with the best plan of care or course of treatment. Therefore, we ask that you please give [place] 2 business days to thoroughly review all your results before contacting the  office for clarification. Should we see a critical lab result, you will be contacted sooner.   If You Need Anything After Your Visit  If you have any questions or concerns for your doctor, please call our main line at [phone] and press option 4 to reach your doctor's medical assistant. If no one answers, please leave a voicemail as directed and we will return your call as soon as possible. Messages left after 4 pm will be answered the following business day.   You may also send [place] a message via MyChart. We typically respond to MyChart messages within 1-2 business days.  For prescription refills, please ask your pharmacy to contact our office. Our fax number is [phone].  If you have an urgent issue when the clinic is closed that cannot wait until the next business day, you can page your doctor at the number below.    Please note that while we do our best to be available for urgent issues outside of office hours,  we are not available 24/7.   If you have an urgent issue and are unable to reach [place], you may choose to seek medical care at your doctor's office, retail clinic, urgent care center, or emergency room.  If you have a medical emergency, please immediately call 911 or go to the emergency department.  Pager Numbers  - Dr. Gwen Pounds: [phone]  - Dr. Roseanne Reno: [phone]  - Dr. Katrinka Blazing: [phone]   In the event of inclement weather, please call our main line at [phone] for an update on the status of any delays or closures.  Dermatology Medication Tips: Please keep the boxes that topical medications come in in order to help keep track of the instructions about where and how to use these. Pharmacies typically print the medication instructions only on the boxes and not directly on the medication tubes.   If your medication is too expensive, please contact our office at [phone] option 4 or send [place] a message through MyChart.   We are unable to tell what your  co-pay for medications will be in advance as this is different depending on your insurance coverage. However, we may be able to find a substitute medication at lower cost or fill out paperwork to get insurance to cover a needed medication.   If a prior authorization is required to get your medication covered by your insurance company, please allow [place] 1-2 business days to complete this process.  Drug prices often vary depending on where the prescription is filled and some pharmacies may offer cheaper prices.  The website www.goodrx.com contains coupons for medications through different pharmacies. The prices here do not account for what the cost may be with help from insurance (it may be cheaper with your insurance), but the website can give you the price if you did not use any insurance.  - You can print the associated coupon and take it with your prescription to the pharmacy.  - You may also stop by our office during regular business hours and pick up a GoodRx coupon card.  - If you need your prescription sent electronically to a different pharmacy, notify our office through [facility] or by phone at [phone] option 4.     Si Usted Necesita Algo Despus de Su Visita  Tambin puede enviarnos un mensaje a travs de Clinical cytogeneticist. Por lo general respondemos a los mensajes de MyChart en el transcurso de 1 a 2 das hbiles.  Para renovar recetas, por favor pida a su farmacia que se ponga en contacto con nuestra oficina. Annie Sable de fax es [place] [phone].  Si tiene un asunto urgente cuando la clnica est cerrada y que no puede esperar hasta el siguiente da hbil, puede llamar/localizar a su doctor(a) al nmero que aparece a continuacin.   Por favor, tenga en cuenta que aunque hacemos todo lo posible para estar disponibles para asuntos urgentes fuera del horario de [place], no estamos disponibles las 24 horas del da, los 7 [address]  [address] de la [place].   Si tiene un problema urgente y no  puede comunicarse con nosotros, puede optar por buscar atencin mdica  en el consultorio de su doctor(a), en una clnica privada, en un centro de atencin urgente o en una sala de emergencias.  Si tiene Engineer, drilling, por favor llame inmediatamente al 911 o vaya a la sala de emergencias.  Nmeros de bper  - Dr. Gwen Pounds: [phone]  - Dra. Roseanne Reno: [phone]  - Dr. Katrinka Blazing: [phone]   En caso de inclemencias  del [place], por favor llame a [place] lnea principal al [phone] para una actualizacin sobre el estado de cualquier retraso o cierre.  Consejos para la medicacin en dermatologa: Por favor, guarde las cajas en las que vienen los medicamentos de uso tpico para ayudarle a seguir las instrucciones sobre dnde y cmo usarlos. Las farmacias generalmente imprimen las instrucciones del medicamento slo en las cajas y no directamente en los tubos del [place].   Si su medicamento es muy caro, por favor, pngase en contacto con Rolm Gala llamando al [phone] y presione la opcin 4 o envenos un mensaje a travs de Clinical cytogeneticist.   No podemos decirle cul ser su copago por los medicamentos por adelantado ya que esto es diferente dependiendo de la cobertura de su seguro. Sin embargo, es posible que podamos encontrar un medicamento sustituto a Audiological scientist un formulario para que el seguro cubra el medicamento que se considera necesario.   Si se requiere una autorizacin previa para que su compaa de seguros [place] su medicamento, por favor permtanos de 1 a 2 das hbiles para completar [address]  Los precios de los medicamentos varan con frecuencia dependiendo del Environmental consultant de dnde se surte la receta y alguna farmacias pueden ofrecer precios ms baratos.  El sitio web www.goodrx.com tiene cupones para medicamentos de Health and safety inspector. Los precios aqu no tienen en cuenta lo que podra costar con la ayuda del seguro (puede ser ms barato con su seguro),  pero el sitio web puede darle el precio si no utiliz Tourist information centre manager.  - Puede imprimir el cupn correspondiente y llevarlo con su receta a la farmacia.  - Tambin puede pasar por nuestra oficina durante el horario de atencin regular y Education officer, museum una tarjeta de cupones de GoodRx.  - Si necesita que su receta se enve electrnicamente a una farmacia diferente, informe a nuestra oficina a travs de MyChart de [place] o por telfono llamando al [phone] y presione la opcin 4.

## 2023-06-19 ENCOUNTER — Other Ambulatory Visit: Payer: Self-pay | Admitting: Obstetrics and Gynecology

## 2023-06-19 DIAGNOSIS — Z1231 Encounter for screening mammogram for malignant neoplasm of breast: Secondary | ICD-10-CM

## 2023-07-08 ENCOUNTER — Ambulatory Visit
Admission: RE | Admit: 2023-07-08 | Discharge: 2023-07-08 | Disposition: A | Discharge status: Home / Self Care | Payer: Managed Care, Other (non HMO) | Source: Ambulatory Visit | Attending: Obstetrics and Gynecology | Admitting: Obstetrics and Gynecology

## 2023-07-08 DIAGNOSIS — Z1231 Encounter for screening mammogram for malignant neoplasm of breast: Secondary | ICD-10-CM | POA: Diagnosis present

## 2024-04-25 ENCOUNTER — Ambulatory Visit: Payer: Managed Care, Other (non HMO) | Admitting: Dermatology

## 2024-07-01 ENCOUNTER — Other Ambulatory Visit: Payer: Self-pay | Admitting: Obstetrics and Gynecology

## 2024-07-01 DIAGNOSIS — Z1231 Encounter for screening mammogram for malignant neoplasm of breast: Secondary | ICD-10-CM
